# Patient Record
Sex: Male | Born: 1990 | Race: Black or African American | Hispanic: No | Marital: Single | Smoking: Current some day smoker
Health system: Southern US, Community
[De-identification: ages and names within clinical notes are randomized; demographics above are authoritative.]

## PROBLEM LIST (undated history)

## (undated) HISTORY — PX: ABDOMINAL SURGERY: SHX537

---
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
## 2016-01-10 DIAGNOSIS — K56609 Unspecified intestinal obstruction, unspecified as to partial versus complete obstruction: Secondary | ICD-10-CM

## 2016-01-10 HISTORY — DX: Unspecified intestinal obstruction, unspecified as to partial versus complete obstruction: K56.609

## 2019-08-29 ENCOUNTER — Other Ambulatory Visit: Payer: Self-pay

## 2019-08-29 ENCOUNTER — Emergency Department (HOSPITAL_BASED_OUTPATIENT_CLINIC_OR_DEPARTMENT_OTHER)
Admission: EM | Admit: 2019-08-29 | Discharge: 2019-08-29 | Discharge status: Absent without leave | Payer: BC Managed Care – PPO

## 2021-06-01 ENCOUNTER — Emergency Department (HOSPITAL_BASED_OUTPATIENT_CLINIC_OR_DEPARTMENT_OTHER)
Admission: EM | Admit: 2021-06-01 | Discharge: 2021-06-01 | Disposition: A | Discharge status: Home / Self Care | Payer: BC Managed Care – PPO | Attending: Emergency Medicine | Admitting: Emergency Medicine

## 2021-06-01 ENCOUNTER — Encounter (HOSPITAL_BASED_OUTPATIENT_CLINIC_OR_DEPARTMENT_OTHER): Payer: Self-pay | Admitting: Emergency Medicine

## 2021-06-01 ENCOUNTER — Emergency Department (HOSPITAL_BASED_OUTPATIENT_CLINIC_OR_DEPARTMENT_OTHER): Payer: BC Managed Care – PPO

## 2021-06-01 DIAGNOSIS — R059 Cough, unspecified: Secondary | ICD-10-CM | POA: Diagnosis present

## 2021-06-01 DIAGNOSIS — J069 Acute upper respiratory infection, unspecified: Secondary | ICD-10-CM | POA: Diagnosis not present

## 2021-06-01 DIAGNOSIS — J4521 Mild intermittent asthma with (acute) exacerbation: Secondary | ICD-10-CM | POA: Diagnosis not present

## 2021-06-01 DIAGNOSIS — Z20822 Contact with and (suspected) exposure to covid-19: Secondary | ICD-10-CM | POA: Insufficient documentation

## 2021-06-01 DIAGNOSIS — J45901 Unspecified asthma with (acute) exacerbation: Secondary | ICD-10-CM

## 2021-06-01 LAB — RESP PANEL BY RT-PCR (FLU A&B, COVID) ARPGX2
Influenza A by PCR: NEGATIVE
Influenza B by PCR: NEGATIVE
SARS Coronavirus 2 by RT PCR: NEGATIVE

## 2021-06-01 MED ORDER — PREDNISONE 20 MG PO TABS: 40.0000 mg | ORAL_TABLET | Freq: Every day | ORAL | 0 refills | Status: AC

## 2021-06-01 MED ORDER — ALBUTEROL SULFATE HFA 108 (90 BASE) MCG/ACT IN AERS: 1.0000 | INHALATION_SPRAY | Freq: Four times a day (QID) | RESPIRATORY_TRACT | 0 refills | Status: DC | PRN

## 2021-06-01 MED ORDER — ALBUTEROL SULFATE HFA 108 (90 BASE) MCG/ACT IN AERS
2.0000 | INHALATION_SPRAY | Freq: Once | RESPIRATORY_TRACT | Status: AC
  Administered 2021-06-01: 2 via RESPIRATORY_TRACT
  Filled 2021-06-01: qty 6.7

## 2021-06-01 NOTE — Discharge Instructions (Addendum)
Your chest x-ray did not show any evidence of pneumonia.  You likely have a viral upper respiratory infection with a component of asthma exacerbation.  I sent prednisone and albuterol into the pharmacy for you.  You have any worsening symptoms return to the emergency room otherwise follow-up with your primary care provider at [facility].  Your COVID and flu test have not resulted yet.  If either one of these is positive I will call you otherwise assume they are negative.

## 2021-06-01 NOTE — ED Triage Notes (Signed)
Pt reports cough and wheezing x 2 days; hx of asthma as a child

## 2021-06-01 NOTE — ED Provider Notes (Signed)
[facility] EMERGENCY DEPARTMENT Provider Note   CSN: [id number] Arrival date & time: 06/01/21  1117     History  Chief Complaint  Patient presents with   Cough    Steven Owens is a 31 y.o. male.  31 year old male presents today for evaluation of 2-day duration of cough, wheezing, nasal congestion, and rhinorrhea.  Denies fever, chills, decreased p.o. intake.  Denies shortness of breath, or chest pain.  Has not tried anything prior to arrival other than Aleve with mild relief.  The history is provided by the patient. No language interpreter was used.      Home Medications Prior to Admission medications   Not on File      Allergies    Patient has no known allergies.    Review of Systems   Review of Systems  Constitutional:  Negative for chills and fever.  HENT:  Positive for congestion. Negative for sore throat.   Respiratory:  Positive for cough and wheezing. Negative for shortness of breath.   Cardiovascular:  Negative for chest pain.  Gastrointestinal:  Negative for abdominal pain.  Neurological:  Negative for light-headedness.  All other systems reviewed and are negative.  Physical Exam Updated Vital Signs BP 124/83   Pulse 72   Temp 98.3 F (36.8 C) (Oral)   Resp 20   Ht 6\' 2"  (1.88 m)   Wt 95.3 kg   SpO2 100%   BMI 26.96 kg/m  Physical Exam Vitals and nursing note reviewed.  Constitutional:      General: He is not in acute distress.    Appearance: Normal appearance. He is not ill-appearing.  HENT:     Head: Normocephalic and atraumatic.     Nose: Nose normal.  Eyes:     General: No scleral icterus.    Extraocular Movements: Extraocular movements intact.     Conjunctiva/sclera: Conjunctivae normal.  Cardiovascular:     Rate and Rhythm: Normal rate and regular rhythm.     Pulses: Normal pulses.  Pulmonary:     Effort: Pulmonary effort is normal. No respiratory distress.     Breath sounds: Wheezing (Faint wheezing) present. No rales.   Abdominal:     General: There is no distension.     Tenderness: There is no abdominal tenderness.  Musculoskeletal:        General: Normal range of motion.     Cervical back: Normal range of motion.  Skin:    General: Skin is warm and dry.  Neurological:     General: No focal deficit present.     Mental Status: He is alert. Mental status is at baseline.    ED Results / Procedures / Treatments   Labs (all labs ordered are listed, but only abnormal results are displayed) Labs Reviewed  RESP PANEL BY RT-PCR (FLU A&B, COVID) [id number]    EKG None  Radiology DG Chest 2 View  Result Date: 06/01/2021 CLINICAL DATA:  Productive cough. Additional history provided: Patient reports cough and wheezing for 2 days, history of asthma as a child. EXAM: CHEST - 2 VIEW COMPARISON:  Prior chest radiographs 04/04/2016 and earlier. FINDINGS: Heart size within normal limits. No appreciable airspace consolidation. No evidence of pleural effusion or pneumothorax. No acute bony abnormality identified. IMPRESSION: No evidence of active cardiopulmonary disease. Electronically Signed   By: 04/06/2016 D.O.   On: 06/01/2021 11:57    Procedures Procedures    Medications Ordered in ED Medications  albuterol (VENTOLIN HFA) 108 (90 Base) MCG/ACT  inhaler 2 puff (2 puffs Inhalation Given 06/01/21 1154)    ED Course/ Medical Decision Making/ A&P                           Medical Decision Making Amount and/or Complexity of Data Reviewed Radiology: ordered.  Risk Prescription drug management.   31 year old male presents today for evaluation of URI symptoms.  Chest x-ray without acute cardiopulmonary process.  COVID and flu Ordered.  Albuterol inhaler provided.  Symptomatic treatment discussed.  Patient voices understanding and is in agreement with plan.  Most likely viral upper respiratory infection with a component of asthma exacerbation.  Will provide albuterol inhaler, and prednisone.  Follow-up with PCP  discussed.  Return precautions discussed. COVID and flu negative.   Final Clinical Impression(s) / ED Diagnoses Final diagnoses:  Viral upper respiratory tract infection  Mild asthma with exacerbation, unspecified whether persistent    Rx / DC Orders ED Discharge Orders          Ordered    albuterol (VENTOLIN HFA) 108 (90 Base) MCG/ACT inhaler  Every 6 hours PRN        06/01/21 1245    predniSONE (DELTASONE) 20 MG tablet  Daily with breakfast        06/01/21 1245              Marita Kansas, PA-C 06/01/21 1317    Maia Plan, MD 06/05/21 1307

## 2021-06-01 NOTE — ED Notes (Signed)
Dc instructions and scripts reviewed with pt no questions or concerns at this time.  

## 2022-01-05 ENCOUNTER — Encounter (HOSPITAL_BASED_OUTPATIENT_CLINIC_OR_DEPARTMENT_OTHER): Payer: Self-pay | Admitting: Emergency Medicine

## 2022-01-05 ENCOUNTER — Other Ambulatory Visit: Payer: Self-pay

## 2022-01-05 ENCOUNTER — Emergency Department (HOSPITAL_BASED_OUTPATIENT_CLINIC_OR_DEPARTMENT_OTHER)
Admission: EM | Admit: 2022-01-05 | Discharge: 2022-01-05 | Disposition: A | Discharge status: Home / Self Care | Payer: BC Managed Care – PPO | Attending: Emergency Medicine | Admitting: Emergency Medicine

## 2022-01-05 DIAGNOSIS — Z1152 Encounter for screening for COVID-19: Secondary | ICD-10-CM | POA: Diagnosis not present

## 2022-01-05 DIAGNOSIS — J101 Influenza due to other identified influenza virus with other respiratory manifestations: Secondary | ICD-10-CM | POA: Insufficient documentation

## 2022-01-05 DIAGNOSIS — J111 Influenza due to unidentified influenza virus with other respiratory manifestations: Secondary | ICD-10-CM

## 2022-01-05 DIAGNOSIS — R519 Headache, unspecified: Secondary | ICD-10-CM | POA: Diagnosis present

## 2022-01-05 LAB — RESP PANEL BY RT-PCR (RSV, FLU A&B, COVID)  RVPGX2
Influenza A by PCR: POSITIVE — AB
Influenza B by PCR: NEGATIVE
Resp Syncytial Virus by PCR: NEGATIVE
SARS Coronavirus 2 by RT PCR: NEGATIVE

## 2022-01-05 MED ORDER — IBUPROFEN 800 MG PO TABS: 800.0000 mg | ORAL_TABLET | Freq: Three times a day (TID) | ORAL | 0 refills | Status: DC | PRN

## 2022-01-05 MED ORDER — FLUTICASONE PROPIONATE 50 MCG/ACT NA SUSP: 2.0000 | Freq: Every day | NASAL | 0 refills | Status: DC

## 2022-01-05 MED ORDER — BENZONATATE 100 MG PO CAPS: 100.0000 mg | ORAL_CAPSULE | Freq: Three times a day (TID) | ORAL | 0 refills | Status: DC | PRN

## 2022-01-05 MED ORDER — ALBUTEROL SULFATE HFA 108 (90 BASE) MCG/ACT IN AERS: 1.0000 | INHALATION_SPRAY | Freq: Four times a day (QID) | RESPIRATORY_TRACT | 0 refills | Status: DC | PRN

## 2022-01-05 MED ORDER — KETOROLAC TROMETHAMINE 30 MG/ML IJ SOLN
30.0000 mg | Freq: Once | INTRAMUSCULAR | Status: AC
  Administered 2022-01-05: 30 mg via INTRAMUSCULAR
  Filled 2022-01-05: qty 1

## 2022-01-05 NOTE — ED Provider Notes (Signed)
Emergency Department Provider Note   I have reviewed the triage vital signs and the nursing notes.   HISTORY  Chief Complaint Headache   HPI Steven Owens is a 31 y.o. male presents the emergency department for evaluation of flulike symptoms.  Patient has had headache along with cough and chills since yesterday.  No known sick contacts.  No chest pain or shortness of breath.  No vomiting or diarrhea.   History reviewed. No pertinent past medical history.  Review of Systems  Constitutional: No fever. Positive chills and fatigue.  Eyes: No visual changes. ENT: Positive sore throat. Cardiovascular: Denies chest pain. Respiratory: Denies shortness of breath. Positive cough.  Gastrointestinal: No abdominal pain.  No nausea, no vomiting.  No diarrhea.  No constipation. Genitourinary: Negative for dysuria. Musculoskeletal: Negative for back pain. Skin: Negative for rash. Neurological: Positive HA.    ____________________________________________   PHYSICAL EXAM:  VITAL SIGNS: Vitals:   01/05/22 0545  BP: (!) 145/88  Pulse: 90  Resp: 18  Temp: 99 F (37.2 C)  SpO2: 97%    Constitutional: Alert and oriented. Well appearing and in no acute distress. Eyes: Conjunctivae are normal.  Head: Atraumatic. Nose: Positive congestion/rhinnorhea. Mouth/Throat: Mucous membranes are moist.  Oropharynx non-erythematous. Neck: No stridor.  Cardiovascular: Normal rate, regular rhythm. Good peripheral circulation. Grossly normal heart sounds.   Respiratory: Normal respiratory effort.  No retractions. Lungs CTAB. Gastrointestinal: Soft and nontender. No distention.  Musculoskeletal: No gross deformities of extremities. Neurologic:  Normal speech and language.  Skin:  Skin is warm, dry and intact. No rash noted.  ____________________________________________   LABS (all labs ordered are listed, but only abnormal results are displayed)  Labs Reviewed  RESP PANEL BY RT-PCR (RSV,  FLU A&B, COVID)  RVPGX2 - Abnormal; Notable for the following components:      Result Value   Influenza A by PCR POSITIVE (*)    All other components within normal limits    __________________________________________   PROCEDURES  Procedure(s) performed:   Procedures  None  ____________________________________________   INITIAL IMPRESSION / ASSESSMENT AND PLAN / ED COURSE  Pertinent labs & imaging results that were available during my care of the patient were reviewed by me and considered in my medical decision making (see chart for details).   This patient is Presenting for Evaluation of flu-like symptoms, which does require a range of treatment options, and is a complaint that involves a moderate risk of morbidity and mortality.  The Differential Diagnoses include COVID, Flu, RSV, CAP, etc.  Critical Interventions-    Medications  ketorolac (TORADOL) 30 MG/ML injection 30 mg (30 mg Intramuscular Given 01/05/22 0554)    Reassessment after intervention: Symptoms improved.    Clinical Laboratory Tests Ordered, included Flu A positive.    Medical Decision Making: Summary:  Presents emergency department with flulike symptoms.  Testing positive here for flu A.  No hypoxemia.  Considered chest x-ray but no adventitious lung sounds.  No hypoxemia.   Patient's presentation is most consistent with acute illness / injury with system symptoms.   Disposition: discharge  ____________________________________________  FINAL CLINICAL IMPRESSION(S) / ED DIAGNOSES  Final diagnoses:  Influenza-like illness     NEW OUTPATIENT MEDICATIONS STARTED DURING THIS VISIT:  Discharge Medication List as of 01/05/2022  5:50 AM     START taking these medications   Details  benzonatate (TESSALON) 100 MG capsule Take 1 capsule (100 mg total) by mouth 3 (three) times daily as needed for cough., Starting Thu  01/05/2022, Normal    fluticasone (FLONASE) 50 MCG/ACT nasal spray Place 2  sprays into both nostrils daily for 7 days., Starting Thu 01/05/2022, Until Thu 01/12/2022, Normal    ibuprofen (ADVIL) 800 MG tablet Take 1 tablet (800 mg total) by mouth every 8 (eight) hours as needed for moderate pain., Starting Thu 01/05/2022, Normal        Note:  This document was prepared using Dragon voice recognition software and may include unintentional dictation errors.  Nanda Quinton, MD, [facility]    Rachid Parham, Wonda Olds, MD 01/10/22 [phone]

## 2022-01-05 NOTE — ED Triage Notes (Signed)
Pt reports HA, cough,and chills since yesterday. No sick contacts he is aware of.

## 2022-01-05 NOTE — Discharge Instructions (Addendum)
You were diagnosed with the flu-like illness.  You will feel ill for as much as a few weeks.  Please take any prescribed medications as instructed, and you may use over-the-counter Tylenol and/or ibuprofen as needed according to label instructions (unless you have an allergy to either or have been told by your doctor not to take them). ° °Follow up with your physician as instructed above, and return to the Emergency Department (ED) if you are unable to tolerate fluids due to vomiting, have worsening trouble breathing, become extremely tired or difficult to awaken, or if you develop any other symptoms that concern you. ° °

## 2022-02-01 ENCOUNTER — Encounter (HOSPITAL_BASED_OUTPATIENT_CLINIC_OR_DEPARTMENT_OTHER): Payer: Self-pay | Admitting: Emergency Medicine

## 2022-02-01 ENCOUNTER — Emergency Department (HOSPITAL_BASED_OUTPATIENT_CLINIC_OR_DEPARTMENT_OTHER): Payer: BC Managed Care – PPO

## 2022-02-01 ENCOUNTER — Other Ambulatory Visit: Payer: Self-pay

## 2022-02-01 ENCOUNTER — Emergency Department (HOSPITAL_BASED_OUTPATIENT_CLINIC_OR_DEPARTMENT_OTHER)
Admission: EM | Admit: 2022-02-01 | Discharge: 2022-02-01 | Disposition: A | Discharge status: Home / Self Care | Payer: BC Managed Care – PPO | Attending: Emergency Medicine | Admitting: Emergency Medicine

## 2022-02-01 DIAGNOSIS — F17200 Nicotine dependence, unspecified, uncomplicated: Secondary | ICD-10-CM | POA: Insufficient documentation

## 2022-02-01 DIAGNOSIS — R0789 Other chest pain: Secondary | ICD-10-CM | POA: Diagnosis not present

## 2022-02-01 DIAGNOSIS — R079 Chest pain, unspecified: Secondary | ICD-10-CM | POA: Diagnosis present

## 2022-02-01 MED ORDER — IBUPROFEN 800 MG PO TABS
800.0000 mg | ORAL_TABLET | Freq: Once | ORAL | Status: AC
  Administered 2022-02-01: 800 mg via ORAL
  Filled 2022-02-01: qty 1

## 2022-02-01 NOTE — ED Provider Notes (Signed)
[place] [facility]ERGENCY DEPARTMENT [facility] Provider Note   CSN: [id number] Arrival date & time: 02/01/22  7989     History  Chief Complaint  Patient presents with   Chest Pain    Steven Owens is a 32 y.o. male.  The history is provided by the patient.  Chest Pain Steven Owens is a 32 y.o. male who presents to the Emergency Department complaining of chest pain.  He presents to the emergency department for evaluation of sharp, right-sided chest pain that started on Monday.  Pain is worse with movement, twisting and bending as well as laying down.  No associated fevers, shortness of breath, cough, abdominal pain, nausea, vomiting, leg swelling or pain.  He is right-hand dominant.  No reported injuries.  He does work for [employer].  He has a remote history of abdominal surgery for a bowel obstruction x 2, no recent surgeries.  No history of DVT/PE.  He uses occasional tobacco.      Home Medications Prior to Admission medications   Medication Sig Start Date End Date Taking? Authorizing Provider  albuterol (VENTOLIN HFA) 108 (90 Base) MCG/ACT inhaler Inhale 1-2 puffs into the lungs every 6 (six) hours as needed. 01/05/22   Long, Wonda Olds, MD  benzonatate (TESSALON) 100 MG capsule Take 1 capsule (100 mg total) by mouth 3 (three) times daily as needed for cough. 01/05/22   Long, Wonda Olds, MD  fluticasone (FLONASE) 50 MCG/ACT nasal spray Place 2 sprays into both nostrils daily for 7 days. 01/05/22 01/12/22  Long, Wonda Olds, MD  ibuprofen (ADVIL) 800 MG tablet Take 1 tablet (800 mg total) by mouth every 8 (eight) hours as needed for moderate pain. 01/05/22   Long, Wonda Olds, MD      Allergies    Patient has no known allergies.    Review of Systems   Review of Systems  Cardiovascular:  Positive for chest pain.  All other systems reviewed and are negative.   Physical Exam Updated Vital Signs BP 131/87 (BP Location: Right Arm)   Pulse 75   Temp 97.8 F (36.6  C) (Oral)   Resp 16   Ht 6\' 1"  (1.854 m)   Wt 96 kg   SpO2 100%   BMI 27.92 kg/m  Physical Exam Vitals and nursing note reviewed.  Constitutional:      Appearance: He is well-developed.  HENT:     Head: Normocephalic and atraumatic.  Cardiovascular:     Rate and Rhythm: Normal rate and regular rhythm.     Heart sounds: No murmur heard. Pulmonary:     Effort: Pulmonary effort is normal. No respiratory distress.     Breath sounds: Normal breath sounds.  Chest:     Chest wall: No tenderness.  Abdominal:     Palpations: Abdomen is soft.     Tenderness: There is no abdominal tenderness. There is no guarding or rebound.  Musculoskeletal:        General: No swelling or tenderness.     Comments: 2+ DP pulses bilaterally  Skin:    General: Skin is warm and dry.  Neurological:     Mental Status: He is alert and oriented to person, place, and time.  Psychiatric:        Behavior: Behavior normal.     ED Results / Procedures / Treatments   Labs (all labs ordered are listed, but only abnormal results are displayed) Labs Reviewed - No data to display  EKG EKG  Interpretation  Date/Time:  Wednesday February 01 2022 05:17:44 EST Ventricular Rate:  72 PR Interval:  157 QRS Duration: 97 QT Interval:  370 QTC Calculation: 405 R Axis:   61 Text Interpretation: Sinus rhythm ST elev, probable normal early repol pattern No previous ECGs available Confirmed by Quintella Reichert [phone]) on 02/01/2022 5:25:57 AM  Radiology DG Chest 2 View  Result Date: 02/01/2022 CLINICAL DATA:  32 year old male with history of chest pain. EXAM: CHEST - 2 VIEW COMPARISON:  Chest x-ray 06/01/2021. FINDINGS: Lung volumes are normal. No consolidative airspace disease. No pleural effusions. No pneumothorax. No pulmonary nodule or mass noted. Pulmonary vasculature and the cardiomediastinal silhouette are within normal limits. IMPRESSION: No radiographic evidence of acute cardiopulmonary disease. Electronically  Signed   By: Vinnie Langton M.D.   On: 02/01/2022 05:37    Procedures Procedures    Medications Ordered in ED Medications  ibuprofen (ADVIL) tablet 800 mg (800 mg Oral Given 02/01/22 6045)    ED Course/ Medical Decision Making/ A&P                             Medical Decision Making Amount and/or Complexity of Data Reviewed Radiology: ordered.  Risk Prescription drug management.   Patient here for evaluation of atraumatic right-sided chest pain.  Pain is reproducible with range of motion of the arms, trunk.  Chest x-ray is negative for acute abnormality-images personally reviewed and interpreted, agree with radiologist interpretation.  EKG is without acute ischemic changes.  He has no associated abdominal tenderness or respiratory symptoms.  Discussed with patient home care for chest pain, likely musculoskeletal in nature.  Discussed OTC analgesics as needed with decreased lifting at work.  Also discussed close return precautions if he has any progressive or new concerning symptoms.  Current clinical picture is not consistent with ACS, PE, dissection, referred abdominal process, pneumonia.        Final Clinical Impression(s) / ED Diagnoses Final diagnoses:  Chest wall pain    Rx / DC Orders ED Discharge Orders     None         Quintella Reichert, MD 02/01/22 [phone]

## 2022-02-01 NOTE — ED Triage Notes (Signed)
Pt states Chest pain since Monday, is worse when moving or laying down. Denies injury.

## 2022-02-01 NOTE — Discharge Instructions (Addendum)
You may take ibuprofen or acetaminophen, available over-the-counter according to label instructions as needed for pain.

## 2022-04-29 ENCOUNTER — Encounter (HOSPITAL_BASED_OUTPATIENT_CLINIC_OR_DEPARTMENT_OTHER): Payer: Self-pay | Admitting: Emergency Medicine

## 2022-04-29 ENCOUNTER — Emergency Department (HOSPITAL_BASED_OUTPATIENT_CLINIC_OR_DEPARTMENT_OTHER): Payer: Self-pay

## 2022-04-29 ENCOUNTER — Emergency Department (HOSPITAL_BASED_OUTPATIENT_CLINIC_OR_DEPARTMENT_OTHER)
Admission: EM | Admit: 2022-04-29 | Discharge: 2022-04-29 | Disposition: A | Discharge status: Home / Self Care | Payer: Self-pay | Attending: Emergency Medicine | Admitting: Emergency Medicine

## 2022-04-29 DIAGNOSIS — S61215A Laceration without foreign body of left ring finger without damage to nail, initial encounter: Secondary | ICD-10-CM | POA: Insufficient documentation

## 2022-04-29 DIAGNOSIS — Z23 Encounter for immunization: Secondary | ICD-10-CM | POA: Insufficient documentation

## 2022-04-29 DIAGNOSIS — W269XXA Contact with unspecified sharp object(s), initial encounter: Secondary | ICD-10-CM | POA: Insufficient documentation

## 2022-04-29 DIAGNOSIS — Y93G3 Activity, cooking and baking: Secondary | ICD-10-CM | POA: Insufficient documentation

## 2022-04-29 MED ORDER — TETANUS-DIPHTH-ACELL PERTUSSIS 5-2.5-18.5 LF-MCG/0.5 IM SUSY
0.5000 mL | PREFILLED_SYRINGE | Freq: Once | INTRAMUSCULAR | Status: AC
  Administered 2022-04-29: 0.5 mL via INTRAMUSCULAR
  Filled 2022-04-29: qty 0.5

## 2022-04-29 NOTE — ED Provider Notes (Signed)
[place] EMERGENCY DEPARTMENT AT [facility] Provider Note   CSN: [id number] Arrival date & time: 04/29/22  1947     History Chief Complaint  Patient presents with   Laceration    Steven Owens is a 32 y.o. male.  Patient presents to the emergency department complaints of a finger laceration.  He reports that he cut his left ring finger while he was cooking earlier tonight.  Reports the bleeding was stopped prior to arrival to the emergency department.  He is unsure if he is up-to-date on his tetanus vaccination.  Believes it may have been more than 10 years.  Reports he still able to move his finger without significant difficulty or pain.  Denies any numbness in the finger.  States that pain is not severe at this point.   Laceration      Home Medications Prior to Admission medications   Medication Sig Start Date End Date Taking? Authorizing Provider  albuterol (VENTOLIN HFA) 108 (90 Base) MCG/ACT inhaler Inhale 1-2 puffs into the lungs every 6 (six) hours as needed. 01/05/22   Long, Arlyss Repress, MD  benzonatate (TESSALON) 100 MG capsule Take 1 capsule (100 mg total) by mouth 3 (three) times daily as needed for cough. 01/05/22   Long, Arlyss Repress, MD  fluticasone (FLONASE) 50 MCG/ACT nasal spray Place 2 sprays into both nostrils daily for 7 days. 01/05/22 01/12/22  Long, Arlyss Repress, MD  ibuprofen (ADVIL) 800 MG tablet Take 1 tablet (800 mg total) by mouth every 8 (eight) hours as needed for moderate pain. 01/05/22   Long, Arlyss Repress, MD      Allergies    Patient has no known allergies.    Review of Systems   Review of Systems  Skin:  Positive for wound.  All other systems reviewed and are negative.   Physical Exam Updated Vital Signs BP (!) 139/96 (BP Location: Right Arm)   Pulse 80   Temp 98.7 F (37.1 C) (Oral)   Resp 18   Ht  (1.854 m)   Wt 99.8 kg   SpO2 98%   BMI 29.03 kg/m  Physical Exam Vitals and nursing note reviewed.  HENT:     Head: Normocephalic  and atraumatic.  Eyes:     General:        Right eye: No discharge.        Left eye: No discharge.     Conjunctiva/sclera: Conjunctivae normal.  Musculoskeletal:        General: No swelling, tenderness or deformity.  Skin:    General: Skin is warm and dry.     Findings: Lesion present.     Comments: 2cm laceration noted to the left ring finger. Depth maybe 1mm with portions of laceration minimally deep.     ED Results / Procedures / Treatments   Labs (all labs ordered are listed, but only abnormal results are displayed) Labs Reviewed - No data to display  EKG None  Radiology DG Finger Ring Left  Result Date: 04/29/2022 CLINICAL DATA:  Pain after laceration EXAM: LEFT RING FINGER 3V COMPARISON:  None Available. FINDINGS: No fracture or dislocation. Preserved joint spaces and bone mineralization. No definite radiopaque foreign body. Overlapping bandage. IMPRESSION: No acute osseous abnormality Electronically Signed   By: Karen Kays M.D.   On: 04/29/2022 21:50    Procedures .Marland KitchenLaceration Repair  Date/Time: 04/29/2022 10:18 PM  Performed by: Smitty Knudsen, PA-C Authorized by: Smitty Knudsen, PA-C   Consent:  Consent obtained:  Verbal   Consent given by:  Patient   Risks, benefits, and alternatives were discussed: yes     Risks discussed:  Infection and pain   Alternatives discussed:  No treatment and referral Universal protocol:    Patient identity confirmed:  Verbally with patient Anesthesia:    Anesthesia method:  None Laceration details:    Location:  Finger   Finger location:  L ring finger   Length (cm):  2   Depth (mm):  1 Pre-procedure details:    Preparation:  Patient was prepped and draped in usual sterile fashion and imaging obtained to evaluate for foreign bodies Treatment:    Area cleansed with:  Povidone-iodine and chlorhexidine   Amount of cleaning:  Standard   Irrigation method:  Pressure wash   Debridement:  None   Undermining:  None Skin  repair:    Repair method:  Tissue adhesive Approximation:    Approximation:  Close Repair type:    Repair type:  Simple Post-procedure details:    Dressing:  Non-adherent dressing   Procedure completion:  Tolerated    Medications Ordered in ED Medications  Tdap (BOOSTRIX) injection 0.5 mL (0.5 mLs Intramuscular Given 04/29/22 2208)    ED Course/ Medical Decision Making/ A&P                           Medical Decision Making Amount and/or Complexity of Data Reviewed Radiology: ordered.  Risk Prescription drug management.   This patient presents to the ED for concern of laceration. Differential diagnosis includes tendon injury, cellulitis, foreign body   Imaging Studies ordered:  I ordered imaging studies including x-ray left ring finger I independently visualized and interpreted imaging which showed no foreign bodies noted I agree with the radiologist interpretation   Medicines ordered and prescription drug management:  I ordered medication including Tdap for tetanus booster Reevaluation of the patient after these medicines showed that the patient unchanged I have reviewed the patients home medicines and have made adjustments as needed   Problem List / ED Course:  Patient presented to the emergency department for a laceration of the left ring finger.  He reports that he injured this while cutting meat for dinner.  He reports the bleeding was contained prior to arriving here in the emergency department.  Not currently any blood thinners.  Reports that he is able to move his finger without any significant difficulty.  Sensation is intact.  Given reassuring presentation, extremity was performed to ensure there is no retained foreign objects in the laceration site.  X-ray imaging was negative.  Laceration repair performed using Dermabond given that laceration had minimal depth.  Patient agreeable with Dermabond repair and verbalized understanding all return precautions have  wound were to reopen.  Advised patient to follow-up with primary care provider for further evaluation to ensure the area is healing properly.  All questions answered prior to patient discharge.  Final Clinical Impression(s) / ED Diagnoses Final diagnoses:  Laceration of left ring finger without foreign body without damage to nail, initial encounter    Rx / DC Orders ED Discharge Orders     None         Smitty Knudsen, PA-C 04/29/22 2220    Melene Plan, DO 04/29/22 2232

## 2022-04-29 NOTE — Discharge Instructions (Signed)
You were seen in the emergency department for a laceration to the left ring finger.  Based on my assessment, I repaired this area using Dermabond adhesive glue.  This area should remain intact for the next 7 to 14 days but I would advise you to avoid soaking the area or submersing in water such as in a bathtub or pool or hot tub.  Please follow-up with your primary care provider for further evaluation to ensure that the area is healing without complications.  You are also given a Tdap booster here in the emergency department given that your Tdap cannot be located on your chart and you are unsure if your booster was more than 30 years old.  If you are concerned that you are actively developing infection in this area, please return to the emergency department or follow-up with your primary care provider.

## 2022-04-29 NOTE — ED Triage Notes (Signed)
Pt w/ laceration to LT ring finger while cooking tonight

## 2023-02-16 IMAGING — DX DG CHEST 2V
2 series · 2 of 2 positions shown · non-contrast
Comparison: Prior chest radiographs 04/04/2016 and earlier.

CLINICAL DATA: Productive cough. Additional history provided:
Patient reports cough and wheezing for 2 days, history of asthma as
a child.

EXAM:
CHEST - 2 VIEW

[chest pa]
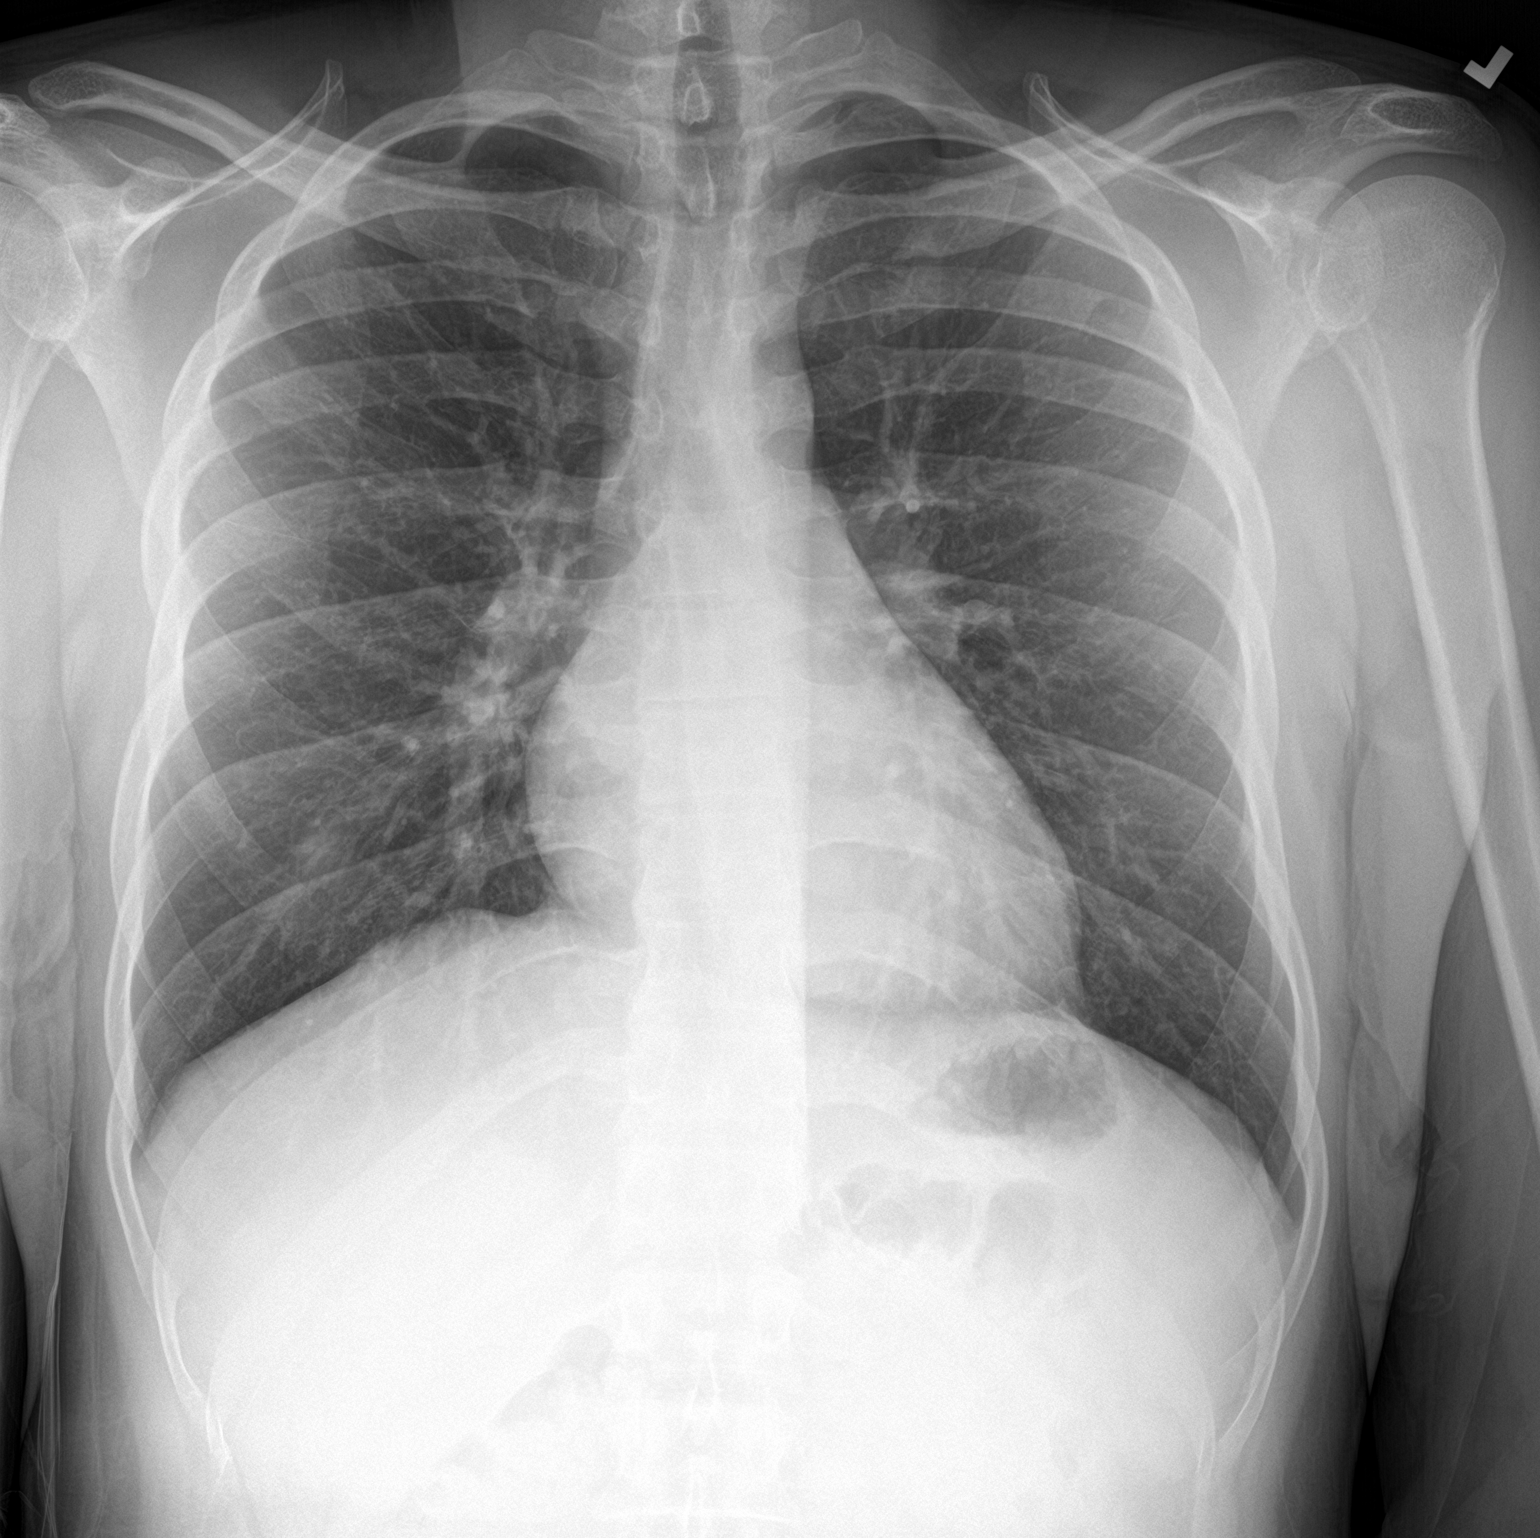

[chest lat]
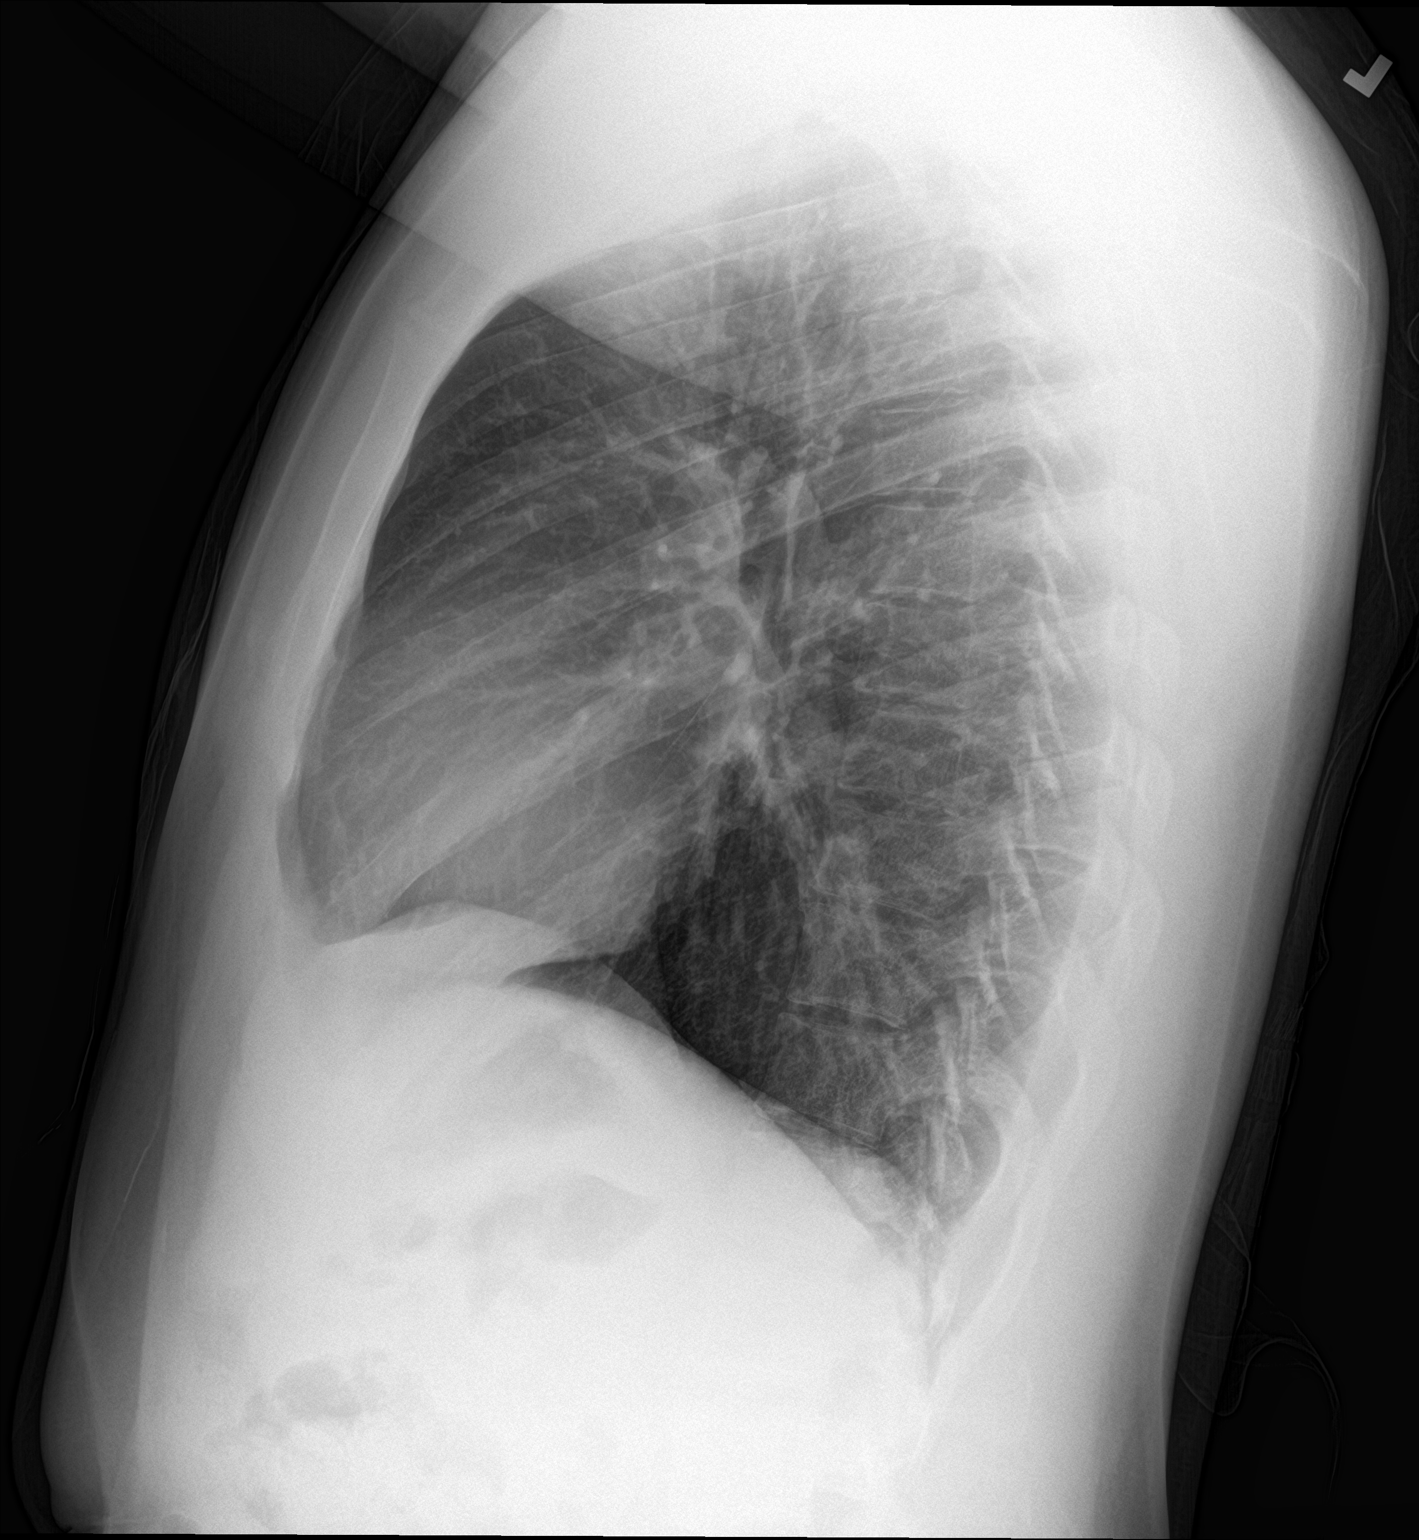

[2 of 2 positions shown; findings below may reference images not displayed]

FINDINGS: Heart size within normal limits. No appreciable airspace
consolidation. No evidence of pleural effusion or pneumothorax. No
acute bony abnormality identified.
IMPRESSION: No evidence of active cardiopulmonary disease.

## 2023-08-14 ENCOUNTER — Inpatient Hospital Stay (HOSPITAL_COMMUNITY): Payer: Self-pay

## 2023-08-14 ENCOUNTER — Encounter (HOSPITAL_BASED_OUTPATIENT_CLINIC_OR_DEPARTMENT_OTHER): Payer: Self-pay

## 2023-08-14 ENCOUNTER — Inpatient Hospital Stay (HOSPITAL_BASED_OUTPATIENT_CLINIC_OR_DEPARTMENT_OTHER)
Admission: EM | Admit: 2023-08-14 | Discharge: 2023-08-18 | DRG: 390 | Disposition: A | Discharge status: Home / Self Care | Payer: MEDICAID | Attending: Internal Medicine | Admitting: Internal Medicine

## 2023-08-14 ENCOUNTER — Emergency Department (HOSPITAL_BASED_OUTPATIENT_CLINIC_OR_DEPARTMENT_OTHER): Payer: Self-pay

## 2023-08-14 ENCOUNTER — Other Ambulatory Visit: Payer: Self-pay

## 2023-08-14 DIAGNOSIS — R109 Unspecified abdominal pain: Secondary | ICD-10-CM

## 2023-08-14 DIAGNOSIS — K56609 Unspecified intestinal obstruction, unspecified as to partial versus complete obstruction: Secondary | ICD-10-CM | POA: Diagnosis present

## 2023-08-14 DIAGNOSIS — F17210 Nicotine dependence, cigarettes, uncomplicated: Secondary | ICD-10-CM | POA: Diagnosis present

## 2023-08-14 DIAGNOSIS — Z8719 Personal history of other diseases of the digestive system: Secondary | ICD-10-CM

## 2023-08-14 DIAGNOSIS — I10 Essential (primary) hypertension: Secondary | ICD-10-CM | POA: Diagnosis present

## 2023-08-14 DIAGNOSIS — Z1152 Encounter for screening for COVID-19: Secondary | ICD-10-CM

## 2023-08-14 DIAGNOSIS — K56600 Partial intestinal obstruction, unspecified as to cause: Principal | ICD-10-CM | POA: Diagnosis present

## 2023-08-14 DIAGNOSIS — K5900 Constipation, unspecified: Secondary | ICD-10-CM | POA: Diagnosis present

## 2023-08-14 LAB — COMPREHENSIVE METABOLIC PANEL WITH GFR
ALT: 27 U/L (ref 0–44)
AST: 22 U/L (ref 15–41)
Albumin: 4.5 g/dL (ref 3.5–5.0)
Alkaline Phosphatase: 47 U/L (ref 38–126)
Anion gap: 12 (ref 5–15)
BUN: 7 mg/dL (ref 6–20)
CO2: 25 mmol/L (ref 22–32)
Calcium: 9.2 mg/dL (ref 8.9–10.3)
Chloride: 102 mmol/L (ref 98–111)
Creatinine, Ser: 0.93 mg/dL (ref 0.61–1.24)
GFR, Estimated: >60 mL/min (ref >60)
Glucose, Bld: 104 mg/dL — ABNORMAL HIGH (ref 70–99)
Potassium: 4.2 mmol/L (ref 3.5–5.1)
Sodium: 139 mmol/L (ref 135–145)
Total Bilirubin: 1.6 mg/dL — ABNORMAL HIGH (ref 0.0–1.2)
Total Protein: 6.9 g/dL (ref 6.5–8.1)

## 2023-08-14 LAB — URINALYSIS, ROUTINE W REFLEX MICROSCOPIC
Bilirubin Urine: NEGATIVE
Glucose, UA: NEGATIVE mg/dL
Hgb urine dipstick: NEGATIVE
Ketones, ur: NEGATIVE mg/dL
Leukocytes,Ua: NEGATIVE
Nitrite: NEGATIVE
Protein, ur: NEGATIVE mg/dL
Specific Gravity, Urine: 1.015 (ref 1.005–1.030)
pH: 6 (ref 5.0–8.0)

## 2023-08-14 LAB — CBC
HCT: 45.6 % (ref 39.0–52.0)
Hemoglobin: 15.1 g/dL (ref 13.0–17.0)
MCH: 29.4 pg (ref 26.0–34.0)
MCHC: 33.1 g/dL (ref 30.0–36.0)
MCV: 88.7 fL (ref 80.0–100.0)
Platelets: 208 K/uL (ref 150–400)
RBC: 5.14 MIL/uL (ref 4.22–5.81)
RDW: 13.1 % (ref 11.5–15.5)
WBC: 3.6 K/uL — ABNORMAL LOW (ref 4.0–10.5)
nRBC: 0 % (ref 0.0–0.2)

## 2023-08-14 LAB — LIPASE, BLOOD: Lipase: 13 U/L (ref 11–51)

## 2023-08-14 MED ORDER — ALBUTEROL SULFATE (2.5 MG/3ML) 0.083% IN NEBU
2.5000 mg | INHALATION_SOLUTION | RESPIRATORY_TRACT | Status: DC | PRN
Start: 2023-08-14 — End: 2023-08-18

## 2023-08-14 MED ORDER — MORPHINE SULFATE (PF) 4 MG/ML IV SOLN
4.0000 mg | Freq: Once | INTRAVENOUS | Status: AC
  Administered 2023-08-14: 4 mg via INTRAVENOUS
  Filled 2023-08-14: qty 1

## 2023-08-14 MED ORDER — ONDANSETRON HCL 4 MG/2ML IJ SOLN: 4.0000 mg | Freq: Four times a day (QID) | INTRAMUSCULAR | Status: DC | PRN

## 2023-08-14 MED ORDER — DIATRIZOATE MEGLUMINE & SODIUM 66-10 % PO SOLN
90.0000 mL | Freq: Once | ORAL | Status: AC
  Administered 2023-08-14: 90 mL via NASOGASTRIC
  Filled 2023-08-14: qty 90

## 2023-08-14 MED ORDER — ACETAMINOPHEN 325 MG PO TABS
650.0000 mg | ORAL_TABLET | Freq: Four times a day (QID) | ORAL | Status: DC | PRN
  Administered 2023-08-15: 650 mg via ORAL
  Filled 2023-08-14: qty 2

## 2023-08-14 MED ORDER — ONDANSETRON HCL 4 MG PO TABS: 4.0000 mg | ORAL_TABLET | Freq: Four times a day (QID) | ORAL | Status: DC | PRN

## 2023-08-14 MED ORDER — ACETAMINOPHEN 650 MG RE SUPP: 650.0000 mg | Freq: Four times a day (QID) | RECTAL | Status: DC | PRN

## 2023-08-14 MED ORDER — ONDANSETRON HCL 4 MG/2ML IJ SOLN
4.0000 mg | Freq: Once | INTRAMUSCULAR | Status: AC
  Administered 2023-08-14: 4 mg via INTRAVENOUS
  Filled 2023-08-14: qty 2

## 2023-08-14 MED ORDER — HYDROMORPHONE HCL 1 MG/ML IJ SOLN: 0.5000 mg | INTRAMUSCULAR | Status: DC | PRN

## 2023-08-14 MED ORDER — SODIUM CHLORIDE 0.9 % IV SOLN: INTRAVENOUS | Status: AC

## 2023-08-14 MED ORDER — ENOXAPARIN SODIUM 40 MG/0.4ML IJ SOSY
40.0000 mg | PREFILLED_SYRINGE | INTRAMUSCULAR | Status: DC
  Administered 2023-08-14 – 2023-08-18 (×5): 40 mg via SUBCUTANEOUS
  Filled 2023-08-14 (×5): qty 0.4

## 2023-08-14 MED ORDER — IOHEXOL 300 MG/ML  SOLN
100.0000 mL | Freq: Once | INTRAMUSCULAR | Status: AC | PRN
  Administered 2023-08-14: 100 mL via INTRAVENOUS

## 2023-08-14 MED ORDER — OXYCODONE HCL 5 MG PO TABS: 5.0000 mg | ORAL_TABLET | ORAL | Status: DC | PRN

## 2023-08-14 NOTE — ED Provider Notes (Signed)
  EMERGENCY DEPARTMENT AT [facility] Provider Note   CSN: [id number] Arrival date & time: 08/14/23  0710     Patient presents with: Abdominal Pain   Steven Owens is a 33 y.o. male.   33 year old male with history of SBO requiring abdominal surgery in 2014 presenting due to 1 day history of lower mid abdominal pain and cramping Denies N/V/D, fevers, chest pain, shortness of breath, heartburn/reflux, dysuria, hematuria  Had a small BM this morning but has been feeling constipated Aside from SBO surgery denies additional surgical hx or medical hx; does not take any medications daily; has not tried any medication for his pain Drinks alcohol occasionally about twice weekly, most recently Saturday, smokes cigarettes every other day, denies marijuana/other illicit drugs. Denies sexual activity or concern for STDs    Abdominal Pain Associated symptoms: constipation   Associated symptoms: no chest pain, no chills, no cough, no diarrhea, no fever, no hematuria, no nausea, no shortness of breath and no vomiting        Prior to Admission medications   Medication Sig Start Date End Date Taking? Authorizing Provider  albuterol  (VENTOLIN  HFA) 108 (90 Base) MCG/ACT inhaler Inhale 1-2 puffs into the lungs every 6 (six) hours as needed. 01/05/22   Long, Fonda MATSU, MD  benzonatate  (TESSALON ) 100 MG capsule Take 1 capsule (100 mg total) by mouth 3 (three) times daily as needed for cough. 01/05/22   Long, Fonda MATSU, MD  fluticasone  (FLONASE ) 50 MCG/ACT nasal spray Place 2 sprays into both nostrils daily for 7 days. 01/05/22 01/12/22  Long, Fonda MATSU, MD  ibuprofen  (ADVIL ) 800 MG tablet Take 1 tablet (800 mg total) by mouth every 8 (eight) hours as needed for moderate pain. 01/05/22   Long, Joshua G, MD    Allergies: Patient has no known allergies.    Review of Systems  Constitutional:  Negative for chills and fever.  Respiratory:  Negative for cough and shortness of breath.    Cardiovascular:  Negative for chest pain.  Gastrointestinal:  Positive for abdominal pain and constipation. Negative for diarrhea, nausea and vomiting.  Genitourinary:  Negative for flank pain, hematuria and testicular pain.  Musculoskeletal:  Negative for back pain.  Neurological:  Negative for weakness and headaches.    Updated Vital Signs BP (!) 142/83 (BP Location: Right Arm)   Pulse 89   Temp 98 F (36.7 C) (Oral)   Resp 16   Ht 6' 1 (1.854 m)   Wt 99.8 kg   SpO2 100%   BMI 29.03 kg/m   Physical Exam Constitutional:      Appearance: He is well-developed.  HENT:     Head: Normocephalic and atraumatic.  Cardiovascular:     Heart sounds: Normal heart sounds. No murmur heard. Pulmonary:     Effort: Pulmonary effort is normal. No respiratory distress.     Breath sounds: Normal breath sounds.  Abdominal:     General: Abdomen is flat. A surgical scar is present. Bowel sounds are normal. There is no distension.     Tenderness: There is abdominal tenderness in the right lower quadrant, suprapubic area, left upper quadrant and left lower quadrant. There is no right CVA tenderness, left CVA tenderness, guarding or rebound.  Neurological:     Mental Status: He is alert.     (all labs ordered are listed, but only abnormal results are displayed) Labs Reviewed  COMPREHENSIVE METABOLIC PANEL WITH GFR - Abnormal; Notable for the following components:  Result Value   Glucose, Bld 104 (*)    Total Bilirubin 1.6 (*)    All other components within normal limits  CBC - Abnormal; Notable for the following components:   WBC 3.6 (*)    All other components within normal limits  LIPASE, BLOOD  URINALYSIS, ROUTINE W REFLEX MICROSCOPIC    EKG: None  Radiology: CT ABDOMEN PELVIS W CONTRAST Result Date: 08/14/2023 CLINICAL DATA:  Abdominal pain, acute, nonlocalized Mid abdominal pain since yesterday. EXAM: CT ABDOMEN AND PELVIS WITH CONTRAST TECHNIQUE: Multidetector CT imaging of  the abdomen and pelvis was performed using the standard protocol following bolus administration of intravenous contrast. RADIATION DOSE REDUCTION: This exam was performed according to the departmental dose-optimization program which includes automated exposure control, adjustment of the mA and/or kV according to patient size and/or use of iterative reconstruction technique. CONTRAST:  OMNIPAQUE  IOHEXOL  300 MG/ML  SOLN COMPARISON:  Abdominopelvic CT 04/01/2016. FINDINGS: Lower chest: Clear lung bases. No significant pleural or pericardial effusion. Hepatobiliary: The liver is normal in density without suspicious focal abnormality. Multiple calcified gallstones are noted. No evidence of gallbladder wall thickening or biliary dilatation. Pancreas: Unremarkable. No pancreatic ductal dilatation or surrounding inflammatory changes. 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 There are new thin septal calcifications within this lesion. No other focal splenic abnormality. Adrenals/Urinary Tract: Both adrenal glands appear normal. No evidence of urinary tract calculus, suspicious renal lesion or hydronephrosis. The bladder appears unremarkable for its degree of distention. Stomach/Bowel: No enteric contrast administered. The stomach appears unremarkable for its degree of distention. Small bowel anastomosis clips are again noted within the left mid abdomen, and there are multiple dilated loops of fluid-filled small bowel, slightly increased from the previous study. The terminal ileum appears decompressed without focal transition point. A fluid-filled structure with internal air in the central false pelvis measures 7.3 x 5.2 cm, appearing contiguous with small bowel on the coronal images, and likely a postsurgical finding. The colon is decompressed. Vascular/Lymphatic: There are no enlarged abdominal or pelvic lymph nodes. No significant vascular  findings. Reproductive: The prostate gland and seminal vesicles appear unremarkable. Other: Postsurgical changes in the anterior abdominal wall. No evidence of hernia, ascites or pneumoperitoneum. Musculoskeletal: No acute or significant osseous findings. IMPRESSION: 1. Recurrent diffuse small bowel dilatation, suspicious for partial distal small bowel obstruction. No focal transition point or etiology identified. 2. Fluid-filled structure with internal air in the central false pelvis appears contiguous with small bowel on the coronal images, and likely a postsurgical finding. 3. Cholelithiasis without evidence of cholecystitis or biliary dilatation. 4. 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 Electronically Signed   By: Elsie Perone M.D.   On: 08/14/2023 10:35     Procedures   Medications Ordered in the ED  morphine  (PF) 4 MG/ML injection 4 mg (4 mg Intravenous Given 08/14/23 0903)  ondansetron  (ZOFRAN ) injection 4 mg (4 mg Intravenous Given 08/14/23 0903)  iohexol  (OMNIPAQUE ) 300 MG/ML solution 100 mL (100 mLs Intravenous Contrast Given 08/14/23 0935)                                    Medical Decision Making 33 year old male with history of SBO requiring surgical intervention in 2014 presenting due to 1 day history of abdominal pain, cramping, constipation Afebrile and hemodynamically stable, with diffuse tenderness but  primarily in RLQ, suprapubic, LUQ, LLQ with normal bowel sounds.  No signs of peritonitis CBC, CMP, lipase, UA ordered Differential includes SBO given his history but reassured by his exam and history Lower concern for infectious etiology, appendicitis given afebrile without vomiting and no leukocytosis Lower concern for hepatobiliary etiology given lack of RUQ pain with normal LFTs and alk phos.  Does have slightly elevated bilirubin. Lower concern for GU etiology given lack of GU symptoms, not sexually active, normal UA Lipase normal and does  not drink excessive alcohol, lower suspicion for acute pancreatitis. No symptoms of GERD to suggest PUD  Given hx of SBO feel it is reasonable to obtain CT abdomen/pelvis with contrast for further evaluation Morphine  for pain with Zofran    10:45 AM CT shows partial SBO without definite transition point Do not feel that surgical intervention or NGT is necessary at this time given his pain is not too severe and without vomiting Would recommend bowel rest and observation overnight Hospitalist consult placed   10:56 AM Spoke with [facility], pt to be admitted to [facility] Advised to reach out to general surgery to FYI - placed consult  12:03 PM Dr. Elnor spoke with on call surgeon. No current plan for NGT or surgery. Pt admitted to [facility]  Amount and/or Complexity of Data Reviewed Labs: ordered. Radiology: ordered.  Risk Prescription drug management. Decision regarding hospitalization.        Final diagnoses:  Partial intestinal obstruction, unspecified cause (HCC)  Abdominal pain, unspecified abdominal location    ED Discharge Orders     None          Romelle Booty, MD 08/14/23 1204    Elnor Savant A, DO 08/15/23 1650

## 2023-08-14 NOTE — Plan of Care (Signed)
   Problem: Education: Goal: Knowledge of General Education information will improve Description Including pain rating scale, medication(s)/side effects and non-pharmacologic comfort measures Outcome: Progressing

## 2023-08-14 NOTE — Consult Note (Signed)
 Consult/Admission Note  Steven Owens 1990-01-16  [id number].    Requesting MD:  Dr. Katha CHRISTELLA [name]  Chief Complaint/Reason for Consult:  Abdominal pain with concern of SBO  HPI:  Blythe Wenner is a 33 year old male with a past medical history of SBO (2014) that presented to [facility] due to abdominal pain. Patient transferred to [facility]. Patient reports that the abdominal pain began yesterday and is centrally located of the abdomen and pelvis. He describes the pain as a sharp sensation. Patient did not try to relieve pain at home. Patient's last bowel movement was this morning. He felt that he was constipated. Denies hematochezia and mucus within stool. Patient's last meal was yesterday (watermelon and water). Denies nausea, vomiting, fevers. Denies concerns with urination.    Of note, patient has had prior abdominal surgeries as an infant/child, but he does not recall specifics. Patient has had a SBO in the past and underwent abdominal surgery for treatment in 2014. He had this completed through [facility]. Patient has no other significant surgical history. Patient has never had a colonoscopy. He takes no other medications daily. Denies anticoagulation medications.   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  Moira Anes (Surgical Oncology). Intestinal adhesions; dense, requiring hours to release during operation [id number] for bowel obstruction. Dehiscence of fascia; POD 7 drainage with intact skin closure, at operation to find infected fascia, debrided  but could not approximate with abdominal vac placed over bowel, subsequently converted to wound vac. The small bowel, likely due to extensive adhesiolysis, was stuck to the peritoneal layer deep to fascia explaining the absence of evisceration.; Last Impression: May 09 2013  wound va currently 10 x 6 cm, no bulge of herniation.   ROS: All negative with the exception of above.   History reviewed. No pertinent family history.  Past Medical History:  Diagnosis Date   SBO (small bowel obstruction) (HCC) 2018    Past Surgical History:  Procedure Laterality Date   ABDOMINAL SURGERY      Social History:  reports that he has been smoking cigarettes and cigars. He has never used smokeless tobacco. He reports current alcohol use. He reports that he does not currently use drugs.  Allergies: No Known Allergies  (Not in a hospital admission)   Vitals:   08/14/23 1159 08/14/23 1243  BP: (!) 142/83 139/88  Pulse: 89 61  Resp: 16 18  Temp: 98 F (36.7 C) 98 F (36.7 C)  SpO2: 100% 94%    Physical Exam:  General: Pleasant, male who is laying in bed in NAD. HEENT: Head is normocephalic, atraumatic.  Sclera are noninjected. Conjunctiva anicteric. EOMI. Heart: Regular, rate, and rhythm. Lungs: Respiratory effort nonlabored Abd: Soft with minimal distention. Generalized tenderness to deep palpation that is more prominent in the umbilical area and central lower pelvis. Midline scarring present from previous procedures No rebound tenderness or guarding. MS: No cyanosis, clubbing, or edema noted of extremities. Skin: Warm and dry with no masses, lesions, or rashes Neuro: Speech clear and moving all extremities with intact sensory Psych: A&Ox3 with an  appropriate affect.   Results for orders placed or performed during the hospital encounter of 08/14/23 (from the past 48 hours)  Lipase, blood     Status: None   Collection Time: 08/14/23  7:24 AM  Result Value Ref Range   Lipase 13 11 - 51  U/L    Comment: Performed at [facility], [address] [place], [place] [address]  Comprehensive metabolic panel     Status: Abnormal   Collection Time: 08/14/23  7:24 AM  Result Value Ref Range   Sodium 139 135 - 145 mmol/L   Potassium 4.2 3.5 - 5.1 mmol/L   Chloride 102 98 - 111 mmol/L   CO2 25 22 - 32 mmol/L   Glucose, Bld 104 (H) 70 - 99 mg/dL    Comment: Glucose reference range applies only to samples taken after fasting for at least 8 hours.   BUN 7 6 - 20 mg/dL   Creatinine, Ser 9.06 0.61 - 1.24 mg/dL   Calcium 9.2 8.9 - 89.6 mg/dL   Total Protein 6.9 6.5 - 8.1 g/dL   Albumin 4.5 3.5 - 5.0 g/dL   AST 22 15 - 41 U/L   ALT 27 0 - 44 U/L   Alkaline Phosphatase 47 38 - 126 U/L   Total Bilirubin 1.6 (H) 0.0 - 1.2 mg/dL   GFR, Estimated >39 >39 mL/min    Comment: (NOTE) Calculated using the CKD-EPI Creatinine Equation (2021)    Anion gap 12 5 - 15    Comment: Performed at [facility], [address] [place], [place] [address]  CBC     Status: Abnormal   Collection Time: 08/14/23  7:24 AM  Result Value Ref Range   WBC 3.6 (L) 4.0 - 10.5 K/uL   RBC 5.14 4.22 - 5.81 MIL/uL   Hemoglobin 15.1 13.0 - 17.0 g/dL   HCT 54.3 60.9 - 47.9 %   MCV 88.7 80.0 - 100.0 fL   MCH 29.4 26.0 - 34.0 pg   MCHC 33.1 30.0 - 36.0 g/dL   RDW 86.8 88.4 - 84.4 %   Platelets 208 150 - 400 K/uL   nRBC 0.0 0.0 - 0.2 %    Comment: Performed at [facility], [address] [place], [place] [address]  Urinalysis, Routine w reflex microscopic -Urine, Clean Catch     Status: None   Collection Time: 08/14/23  7:24 AM  Result Value Ref Range   Color, Urine YELLOW YELLOW   APPearance CLEAR CLEAR   Specific Gravity, Urine 1.015 1.005 - 1.030   pH 6.0 5.0 - 8.0   Glucose, UA NEGATIVE NEGATIVE mg/dL   Hgb urine dipstick NEGATIVE NEGATIVE   Bilirubin Urine NEGATIVE NEGATIVE   Ketones, ur NEGATIVE NEGATIVE mg/dL   Protein, ur NEGATIVE NEGATIVE mg/dL   Nitrite NEGATIVE  NEGATIVE   Leukocytes,Ua NEGATIVE NEGATIVE    Comment: Microscopic not done on urines with negative protein, blood, leukocytes, nitrite, or glucose < 500 mg/dL. Performed at [facility], [address] [place], [place] [address]    CT ABDOMEN PELVIS W CONTRAST Result Date: 08/14/2023 CLINICAL DATA:  Abdominal pain, acute, nonlocalized Mid abdominal pain since yesterday. EXAM: CT ABDOMEN AND PELVIS WITH CONTRAST TECHNIQUE: Multidetector CT imaging of the abdomen and pelvis was performed using the standard protocol following bolus administration of intravenous contrast. RADIATION DOSE REDUCTION: This exam was performed according to the departmental dose-optimization program which includes automated exposure control,  adjustment of the mA and/or kV according to patient size and/or use of iterative reconstruction technique. CONTRAST:  OMNIPAQUE  IOHEXOL  300 MG/ML  SOLN COMPARISON:  Abdominopelvic CT 04/01/2016. FINDINGS: Lower chest: Clear lung bases. No significant pleural or pericardial effusion. Hepatobiliary: The liver is normal in density without suspicious focal abnormality. Multiple calcified gallstones are noted. No evidence of gallbladder wall thickening or biliary dilatation. Pancreas: Unremarkable. No pancreatic ductal dilatation or surrounding inflammatory changes. 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 There are new thin septal calcifications within this lesion. No other focal splenic abnormality. Adrenals/Urinary Tract: Both adrenal glands appear normal. No evidence of urinary tract calculus, suspicious renal lesion or hydronephrosis. The bladder appears unremarkable for its degree of distention. Stomach/Bowel: No enteric contrast administered. The stomach appears unremarkable for its degree of distention. Small bowel anastomosis clips are again noted within the left mid abdomen, and there are multiple  dilated loops of fluid-filled small bowel, slightly increased from the previous study. The terminal ileum appears decompressed without focal transition point. A fluid-filled structure with internal air in the central false pelvis measures 7.3 x 5.2 cm, appearing contiguous with small bowel on the coronal images, and likely a postsurgical finding. The colon is decompressed. Vascular/Lymphatic: There are no enlarged abdominal or pelvic lymph nodes. No significant vascular findings. Reproductive: The prostate gland and seminal vesicles appear unremarkable. Other: Postsurgical changes in the anterior abdominal wall. No evidence of hernia, ascites or pneumoperitoneum. Musculoskeletal: No acute or significant osseous findings. IMPRESSION: 1. Recurrent diffuse small bowel dilatation, suspicious for partial distal small bowel obstruction. No focal transition point or etiology identified. 2. Fluid-filled structure with internal air in the central false pelvis appears contiguous with small bowel on the coronal images, and likely a postsurgical finding. 3. Cholelithiasis without evidence of cholecystitis or biliary dilatation. 4. 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 Electronically Signed   By: Elsie Perone M.D.   On: 08/14/2023 10:35      Assessment/Plan History of SBO requiring surgical intervention SBO -CT abd/pel w/ contrast from 08/14/2023 showed recurrent diffuse small bowel dilation, suspicious for partial distal small bowel obstruction. No focal transition point or etiology identified. Fluid-filled structure with internal air in the central false pelvis appears contiguous with small bowel on the coronal images, and likely a postsurgical finding. Cholelithiasis without evidence of cholecystitis or biliary dilatation. 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 -Lipase 13 -Total bilirubin 1.6 -WBC 3.6 -UA negative -Afebrile. -Discussed  history, labs, and imaging with patient that is consistent with SBO. Reviewed imaging with MD. Will proceed with SBO protocol. NGT to be placed with NPO diet.   FEN: NPO; NGT to be placed; 0.9% sodium chloride  infusion at 150 mL/hr  VTE: Enoxaparin  ID: None   I reviewed ED provider notes, last 24 h vitals and pain scores, last 48 h intake and output, last 24 h labs and trends, and last 24 h imaging results.  This care required high  level of medical decision making.   Marjorie Carlyon Favre, [facility] 08/14/2023, 11:47 AM Please see Amion for pager number during day hours 7:00am-4:30pm

## 2023-08-14 NOTE — ED Triage Notes (Signed)
 C/o mid abdominal pain since yesterday. Hx of SBO w/ surgical intervention. Denies N/V/D. Small BM this morning.

## 2023-08-14 NOTE — H&P (Signed)
 History and Physical  Steven Owens [name] DOB: 1990/02/19 DOA: 08/14/2023  PCP: System, Provider Not In   Chief Complaint: Abdominal pain  HPI: [name] is a 33 y.o. male with medical history significant for abdominal surgery as an infant resulting in small bowel obstruction and surgical repair approximately 1 decade ago now being admitted to the hospital for 1 day of lower abdominal pain found to have partial small bowel obstruction.  Patient was admitted to the hospital in [facility] about a decade ago, from his description sounds like perhaps he had lysis of adhesions, he does not think any bowel was removed.  He is pretty healthy, does not take any regular medications starting yesterday morning he was having lower abdominal pain without radiation or associated fevers, nausea, or diarrhea.  He had a small bowel movement this morning.  He does feel bloated.  Although yesterday he did not have much of an appetite due to the abdominal pain.  He does not think he has been passing gas this morning.  He presented to the ER for evaluation and workup as detailed below shows evidence of partial small bowel obstruction.  Review of Systems: Please see HPI for pertinent positives and negatives. A complete 10 system review of systems are otherwise negative.  Past Medical History:  Diagnosis Date   SBO (small bowel obstruction) (HCC) 2018   Past Surgical History:  Procedure Laterality Date   ABDOMINAL SURGERY     Social History:  reports that he has been smoking cigarettes and cigars. He has never used smokeless tobacco. He reports current alcohol use. He reports that he does not currently use drugs.  No Known Allergies  History reviewed. No pertinent family history.   Prior to Admission medications   Medication Sig Start Date End Date Taking? Authorizing Provider  albuterol  (VENTOLIN  HFA) 108 (90 Base) MCG/ACT inhaler Inhale 1-2 puffs into the lungs every 6 (six) hours as needed.  01/05/22   Long, Fonda MATSU, MD  benzonatate  (TESSALON ) 100 MG capsule Take 1 capsule (100 mg total) by mouth 3 (three) times daily as needed for cough. 01/05/22   Long, Fonda MATSU, MD  fluticasone  (FLONASE ) 50 MCG/ACT nasal spray Place 2 sprays into both nostrils daily for 7 days. 01/05/22 01/12/22  Long, Fonda MATSU, MD  ibuprofen  (ADVIL ) 800 MG tablet Take 1 tablet (800 mg total) by mouth every 8 (eight) hours as needed for moderate pain. 01/05/22   Long, Fonda MATSU, MD    Physical Exam: BP 139/88 (BP Location: Right Arm)   Pulse 61   Temp 98 F (36.7 C) (Oral)   Resp 18   Ht 6' 1 (1.854 m)   Wt 99.8 kg   SpO2 94%   BMI 29.03 kg/m  General:  Alert, oriented, calm, in no acute distress, resting comfortably on room air. Cardiovascular: RRR, no murmurs or rubs, no peripheral edema  Respiratory: clear to auscultation bilaterally, no wheezes, no crackles  Abdomen: soft, tender to deep palpation in the bilateral lower quadrants, appears minimally distended Skin: dry, no rashes  Musculoskeletal: no joint effusions, normal range of motion  Psychiatric: appropriate affect, normal speech  Neurologic: extraocular muscles intact, clear speech, moving all extremities with intact sensorium         Labs on Admission:  Basic Metabolic Panel: Recent Labs  Lab 08/14/23 0724  NA 139  K 4.2  CL 102  CO2 25  GLUCOSE 104*  BUN 7  CREATININE 0.93  CALCIUM 9.2   Liver  Function Tests: Recent Labs  Lab 08/14/23 0724  AST 22  ALT 27  ALKPHOS 47  BILITOT 1.6*  PROT 6.9  ALBUMIN 4.5   Recent Labs  Lab 08/14/23 0724  LIPASE 13   No results for input(s): AMMONIA in the last 168 hours. CBC: Recent Labs  Lab 08/14/23 0724  WBC 3.6*  HGB 15.1  HCT 45.6  MCV 88.7  PLT 208   Cardiac Enzymes: No results for input(s): CKTOTAL, CKMB, CKMBINDEX, TROPONINI in the last 168 hours. BNP (last 3 results) No results for input(s): BNP in the last 8760 hours.  ProBNP (last 3 results) No  results for input(s): PROBNP in the last 8760 hours.  CBG: No results for input(s): GLUCAP in the last 168 hours.  Radiological Exams on Admission: CT ABDOMEN PELVIS W CONTRAST Result Date: 08/14/2023 CLINICAL DATA:  Abdominal pain, acute, nonlocalized Mid abdominal pain since yesterday. EXAM: CT ABDOMEN AND PELVIS WITH CONTRAST TECHNIQUE: Multidetector CT imaging of the abdomen and pelvis was performed using the standard protocol following bolus administration of intravenous contrast. RADIATION DOSE REDUCTION: This exam was performed according to the departmental dose-optimization program which includes automated exposure control, adjustment of the mA and/or kV according to patient size and/or use of iterative reconstruction technique. CONTRAST:  OMNIPAQUE  IOHEXOL  300 MG/ML  SOLN COMPARISON:  Abdominopelvic CT 04/01/2016. FINDINGS: Lower chest: Clear lung bases. No significant pleural or pericardial effusion. Hepatobiliary: The liver is normal in density without suspicious focal abnormality. Multiple calcified gallstones are noted. No evidence of gallbladder wall thickening or biliary dilatation. Pancreas: Unremarkable. No pancreatic ductal dilatation or surrounding inflammatory changes. 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 There are new thin septal calcifications within this lesion. No other focal splenic abnormality. Adrenals/Urinary Tract: Both adrenal glands appear normal. No evidence of urinary tract calculus, suspicious renal lesion or hydronephrosis. The bladder appears unremarkable for its degree of distention. Stomach/Bowel: No enteric contrast administered. The stomach appears unremarkable for its degree of distention. Small bowel anastomosis clips are again noted within the left mid abdomen, and there are multiple dilated loops of fluid-filled small bowel, slightly increased from the previous study. The terminal  ileum appears decompressed without focal transition point. A fluid-filled structure with internal air in the central false pelvis measures 7.3 x 5.2 cm, appearing contiguous with small bowel on the coronal images, and likely a postsurgical finding. The colon is decompressed. Vascular/Lymphatic: There are no enlarged abdominal or pelvic lymph nodes. No significant vascular findings. Reproductive: The prostate gland and seminal vesicles appear unremarkable. Other: Postsurgical changes in the anterior abdominal wall. No evidence of hernia, ascites or pneumoperitoneum. Musculoskeletal: No acute or significant osseous findings. IMPRESSION: 1. Recurrent diffuse small bowel dilatation, suspicious for partial distal small bowel obstruction. No focal transition point or etiology identified. 2. Fluid-filled structure with internal air in the central false pelvis appears contiguous with small bowel on the coronal images, and likely a postsurgical finding. 3. Cholelithiasis without evidence of cholecystitis or biliary dilatation. 4. 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 Electronically Signed   By: Elsie Perone M.D.   On: 08/14/2023 10:35   Assessment/Plan Joevanni Brittian is a 33 y.o. male with medical history significant for abdominal surgery as an infant resulting in small bowel obstruction and surgical repair approximately 1 decade ago now being admitted to the hospital for 1 day of lower abdominal pain found to have partial small  bowel obstruction.   Partial small bowel obstruction-with abdominal pain for the last 24 hours, without obvious transition point or etiology seen on CT scan.  Patient is currently comfortable without nausea. -Inpatient admission -N.p.o. -Pain and nausea medication -IV fluids -General Surgery consulted -Consider NG tube in case of vomiting  Hypertension-I suspect currently elevated due to discomfort, will monitor closely  DVT prophylaxis: Lovenox       Code Status: Full Code  Consults called: None  Admission status: The appropriate patient status for this patient is INPATIENT. Inpatient status is judged to be reasonable and necessary in order to provide the required intensity of service to ensure the patient's safety. The patient's presenting symptoms, physical exam findings, and initial radiographic and laboratory data in the context of their chronic comorbidities is felt to place them at high risk for further clinical deterioration. Furthermore, it is not anticipated that the patient will be medically stable for discharge from the hospital within 2 midnights of admission.    I certify that at the point of admission it is my clinical judgment that the patient will require inpatient hospital care spanning beyond 2 midnights from the point of admission due to high intensity of service, high risk for further deterioration and high frequency of surveillance required  Time spent: 53 minutes  Solei Wubben CHRISTELLA [name] MD [facility] Pager [phone]  If 7PM-7AM, please contact night-coverage [email or web address] Password [email or web address]  08/14/2023, 1:02 PM

## 2023-08-15 DIAGNOSIS — R109 Unspecified abdominal pain: Secondary | ICD-10-CM

## 2023-08-15 DIAGNOSIS — K56600 Partial intestinal obstruction, unspecified as to cause: Secondary | ICD-10-CM

## 2023-08-15 LAB — BASIC METABOLIC PANEL WITH GFR
Anion gap: 15 (ref 5–15)
BUN: 10 mg/dL (ref 6–20)
CO2: 18 mmol/L — ABNORMAL LOW (ref 22–32)
Calcium: 8.2 mg/dL — ABNORMAL LOW (ref 8.9–10.3)
Chloride: 101 mmol/L (ref 98–111)
Creatinine, Ser: 0.89 mg/dL (ref 0.61–1.24)
GFR, Estimated: >60 mL/min (ref >60)
Glucose, Bld: 64 mg/dL — ABNORMAL LOW (ref 70–99)
Potassium: 4.7 mmol/L (ref 3.5–5.1)
Sodium: 134 mmol/L — ABNORMAL LOW (ref 135–145)

## 2023-08-15 LAB — CBC
HCT: 46.4 % (ref 39.0–52.0)
Hemoglobin: 14.6 g/dL (ref 13.0–17.0)
MCH: 29.2 pg (ref 26.0–34.0)
MCHC: 31.5 g/dL (ref 30.0–36.0)
MCV: 92.8 fL (ref 80.0–100.0)
Platelets: 196 K/uL (ref 150–400)
RBC: 5 MIL/uL (ref 4.22–5.81)
RDW: 13 % (ref 11.5–15.5)
WBC: 3.3 K/uL — ABNORMAL LOW (ref 4.0–10.5)
nRBC: 0 % (ref 0.0–0.2)

## 2023-08-15 LAB — HIV ANTIBODY (ROUTINE TESTING W REFLEX): HIV Screen 4th Generation wRfx: NONREACTIVE

## 2023-08-15 NOTE — Hospital Course (Signed)
 33 y.o. male with medical history significant for abdominal surgery as an infant resulting in small bowel obstruction and surgical repair approximately 1 decade ago now being admitted to the hospital for 1 day of lower abdominal pain found to have partial small bowel obstruction.  Patient was admitted to the hospital in [facility] about a decade ago, from his description sounds like perhaps he had lysis of adhesions, he does not think any bowel was removed.  He is pretty healthy, does not take any regular medications starting yesterday morning he was having lower abdominal pain without radiation or associated fevers, nausea, or diarrhea.  He had a small bowel movement this morning.  He does feel bloated.  Although yesterday he did not have much of an appetite due to the abdominal pain.  He does not think he has been passing gas this morning.  He presented to the ER for evaluation and workup as detailed below shows evidence of partial small bowel obstruction.

## 2023-08-15 NOTE — Progress Notes (Signed)
  Progress Note   Patient: Steven Owens [name] DOB: July 10, 1990 DOA: 08/14/2023     1 DOS: the patient was seen and examined on 08/15/2023   Brief hospital course: 33 y.o. male with medical history significant for abdominal surgery as an infant resulting in small bowel obstruction and surgical repair approximately 1 decade ago now being admitted to the hospital for 1 day of lower abdominal pain found to have partial small bowel obstruction.  Patient was admitted to the hospital in [facility] about a decade ago, from his description sounds like perhaps he had lysis of adhesions, he does not think any bowel was removed.  He is pretty healthy, does not take any regular medications starting yesterday morning he was having lower abdominal pain without radiation or associated fevers, nausea, or diarrhea.  He had a small bowel movement this morning.  He does feel bloated.  Although yesterday he did not have much of an appetite due to the abdominal pain.  He does not think he has been passing gas this morning.  He presented to the ER for evaluation and workup as detailed below shows evidence of partial small bowel obstruction.   Assessment and Plan: Partial small bowel obstruction-with abdominal pain for the last 24 hours, without obvious transition point or etiology seen on CT scan.  P -NG in place this AM -Pain and nausea medication as needed -General Surgery consulted. Recs for clamping trial of NG with clears --contrast noted in rectum on SBO protocol study -encourage ambulation as tolerated   Hypertension- -likely secondary to feeling uncomfortable -Cont to monitor for now   Subjective: Without complaints this AM  Physical Exam: Vitals:   08/14/23 2132 08/15/23 0621 08/15/23 1006 08/15/23 1355  BP: (!) 143/86 (!) 143/90 (!) 153/84 (!) 164/85  Pulse: 65 90 66 70  Resp: 15 18 18 18   Temp: 98.4 F (36.9 C) 98.2 F (36.8 C) 98.4 F (36.9 C) 97.7 F (36.5 C)  TempSrc: Oral Oral Oral Oral   SpO2: 100% 100% 99% 99%  Weight:      Height:       General exam: Awake, laying in bed, in nad Respiratory system: Normal respiratory effort, no wheezing Cardiovascular system: regular rate, s1, s2 Gastrointestinal system: Soft, distended, decreased BS Central nervous system: CN2-12 grossly intact, strength intact Extremities: Perfused, no clubbing Skin: Normal skin turgor, no notable skin lesions seen Psychiatry: Mood normal // no visual hallucinations   Data Reviewed:  Labs reviewed: Na Na 134, K 4.7, Cr 0.89, Hgb 14.6   Family Communication: Pt in room, family not at bedside  Disposition: Status is: Inpatient Remains inpatient appropriate because: severity of illness  Planned Discharge Destination: Home     Author: Garnette Pelt, MD 08/15/2023 6:30 PM  For on call review [email or web address].

## 2023-08-15 NOTE — TOC Initial Note (Signed)
 Transition of Care [facility]) - Initial/Assessment Note   Patient Details  Name: Steven Owens MRN: [id number] Date of Birth: 21-Sep-1990  [facility]) CM/SW Contact:    Duwaine GORMAN Aran, LCSW Phone Number: 08/15/2023, 1:03 PM  Clinical Narrative: Patient does not have a PCP and is uninsured. Patient agreeable to CSW providing him with a list of PCPs. CSW provided list to patient.  Expected Discharge Plan: Home/Self Care Barriers to Discharge: Continued Medical Work up  Patient Goals and CMS Choice Patient states their goals for this hospitalization and ongoing recovery are:: Home Choice offered to / list presented to : NA  Expected Discharge Plan and Services In-house Referral: Clinical Social Work Post Acute Care Choice: NA Living arrangements for the past 2 months: Apartment            DME Arranged: N/A DME Agency: NA  Prior Living Arrangements/Services Living arrangements for the past 2 months: Apartment Patient language and need for interpreter reviewed:: Yes Do you feel safe going back to the place where you live?: Yes      Need for Family Participation in Patient Care: No (Comment) Care giver support system in place?: Yes (comment) Criminal Activity/Legal Involvement Pertinent to Current Situation/Hospitalization: No - Comment as needed  Activities of Daily Living ADL Screening (condition at time of admission) Independently performs ADLs?: Yes (appropriate for developmental age) Is the patient deaf or have difficulty hearing?: No Does the patient have difficulty seeing, even when wearing glasses/contacts?: No Does the patient have difficulty concentrating, remembering, or making decisions?: No  Emotional Assessment Attitude/Demeanor/Rapport: Engaged Affect (typically observed): Accepting Orientation: : Oriented to Self, Oriented to Place, Oriented to  Time, Oriented to Situation Alcohol / Substance Use: Not Applicable Psych Involvement: No (comment)  Admission  diagnosis:  SBO (small bowel obstruction) (HCC) [K56.609] Abdominal pain, unspecified abdominal location [R10.9] Partial intestinal obstruction, unspecified cause (HCC) [K56.600] Patient Active Problem List   Diagnosis Date Noted   SBO (small bowel obstruction) (HCC) 08/14/2023   PCP:  System, Provider Not In Pharmacy:   [facility] [phone] - [place], [place] - [address] [place] [address] Phone: [phone] Fax: [phone]  [place] [facility] [address] [place] [address] Phone: [phone] Fax: [phone]  Social Drivers of Health (SDOH) Social History: SDOH Screenings   Food Insecurity: Patient Declined (08/14/2023)  Housing: Patient Declined (08/14/2023)  Transportation Needs: Patient Declined (08/14/2023)  Utilities: Patient Declined (08/14/2023)  Tobacco Use: High Risk (08/14/2023)   SDOH Interventions:    Readmission Risk Interventions     No data to display

## 2023-08-15 NOTE — Progress Notes (Signed)
 Progress Note     Subjective: Patient reports generalized abdominal pain only with movement. Has not had a bowel movement or flatulence. Denies nausea and vomiting.   ROS  All negative with the exception of above.  Objective: Vital signs in last 24 hours: Temp:  [98 F (36.7 C)-98.4 F (36.9 C)] 98.2 F (36.8 C) (08/06 0621) Pulse Rate:  [53-90] 90 (08/06 0621) Resp:  [15-18] 18 (08/06 0621) BP: (133-145)/(74-91) 143/90 (08/06 0621) SpO2:  [92 %-100 %] 100 % (08/06 0621) Last BM Date : 08/13/23  Intake/Output from previous day: 08/05 0701 - 08/06 0700 In: 2455.4 [I.V.:2455.4] Out: 600 [Urine:350; Emesis/NG output:250] Intake/Output this shift: No intake/output data recorded.  PE: General: Pleasant male who is laying in bed in NAD Lungs: Respiratory effort nonlabored Abd: Soft and ND. Generalized tenderness to deep palpation that is more prominent in the RUQ and RLQ. Midline scarring present from previous procedures. No rebound tenderness or guarding.  Psych: A&Ox3 with an appropriate affect.    Lab Results:  Recent Labs    08/14/23 0724 08/15/23 0456  WBC 3.6* 3.3*  HGB 15.1 14.6  HCT 45.6 46.4  PLT 208 196   BMET Recent Labs    08/14/23 0724 08/15/23 0456  NA 139 134*  K 4.2 4.7  CL 102 101  CO2 25 18*  GLUCOSE 104* 64*  BUN 7 10  CREATININE 0.93 0.89  CALCIUM 9.2 8.2*   PT/INR No results for input(s): LABPROT, INR in the last 72 hours. CMP     Component Value Date/Time   NA 134 (L) 08/15/2023 0456   K 4.7 08/15/2023 0456   CL 101 08/15/2023 0456   CO2 18 (L) 08/15/2023 0456   GLUCOSE 64 (L) 08/15/2023 0456   BUN 10 08/15/2023 0456   CREATININE 0.89 08/15/2023 0456   CALCIUM 8.2 (L) 08/15/2023 0456   PROT 6.9 08/14/2023 0724   ALBUMIN 4.5 08/14/2023 0724   AST 22 08/14/2023 0724   ALT 27 08/14/2023 0724   ALKPHOS 47 08/14/2023 0724   BILITOT 1.6 (H) 08/14/2023 0724   GFRNONAA >60 08/15/2023 0456   Lipase     Component Value  Date/Time   LIPASE 13 08/14/2023 0724       Studies/Results: DG Abd Portable 1V-Small Bowel Obstruction Protocol-initial, 8 hr delay Result Date: 08/15/2023 CLINICAL DATA:  Small bowel protocol, 8 hours post contrast EXAM: PORTABLE ABDOMEN - 1 VIEW COMPARISON:  CT abdomen pelvis 08/14/2023, x-ray abdomen 08/14/2023 2:49 p.m. FINDINGS: Enteric tube with tip overlying the expected region of the gastric lumen. PO contrast reaches the rectum. The bowel gas pattern is normal. No radio-opaque calculi or other significant radiographic abnormality are seen. IMPRESSION: 1. PO contrast reaches the rectum. 2. Nonobstructive bowel gas pattern. Electronically Signed   By: Morgane  Naveau M.D.   On: 08/15/2023 00:11   DG Abd Portable 1V-Small Bowel Protocol-Position Verification Result Date: 08/14/2023 CLINICAL DATA:  NG tube placement EXAM: PORTABLE ABDOMEN - 1 VIEW COMPARISON:  CT today FINDINGS: NG tube tip is in the mid stomach.  Visualized lungs clear. IMPRESSION: NG tube in the mid stomach. Electronically Signed   By: Franky Crease M.D.   On: 08/14/2023 15:19   CT ABDOMEN PELVIS W CONTRAST Result Date: 08/14/2023 CLINICAL DATA:  Abdominal pain, acute, nonlocalized Mid abdominal pain since yesterday. EXAM: CT ABDOMEN AND PELVIS WITH CONTRAST TECHNIQUE: Multidetector CT imaging of the abdomen and pelvis was performed using the standard protocol following bolus administration of intravenous contrast. RADIATION  DOSE REDUCTION: This exam was performed according to the departmental dose-optimization program which includes automated exposure control, adjustment of the mA and/or kV according to patient size and/or use of iterative reconstruction technique. CONTRAST:  OMNIPAQUE  IOHEXOL  300 MG/ML  SOLN COMPARISON:  Abdominopelvic CT 04/01/2016. FINDINGS: Lower chest: Clear lung bases. No significant pleural or pericardial effusion. Hepatobiliary: The liver is normal in density without suspicious focal abnormality.  Multiple calcified gallstones are noted. No evidence of gallbladder wall thickening or biliary dilatation. Pancreas: Unremarkable. No pancreatic ductal dilatation or surrounding inflammatory changes. 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 There are new thin septal calcifications within this lesion. No other focal splenic abnormality. Adrenals/Urinary Tract: Both adrenal glands appear normal. No evidence of urinary tract calculus, suspicious renal lesion or hydronephrosis. The bladder appears unremarkable for its degree of distention. Stomach/Bowel: No enteric contrast administered. The stomach appears unremarkable for its degree of distention. Small bowel anastomosis clips are again noted within the left mid abdomen, and there are multiple dilated loops of fluid-filled small bowel, slightly increased from the previous study. The terminal ileum appears decompressed without focal transition point. A fluid-filled structure with internal air in the central false pelvis measures 7.3 x 5.2 cm, appearing contiguous with small bowel on the coronal images, and likely a postsurgical finding. The colon is decompressed. Vascular/Lymphatic: There are no enlarged abdominal or pelvic lymph nodes. No significant vascular findings. Reproductive: The prostate gland and seminal vesicles appear unremarkable. Other: Postsurgical changes in the anterior abdominal wall. No evidence of hernia, ascites or pneumoperitoneum. Musculoskeletal: No acute or significant osseous findings. IMPRESSION: 1. Recurrent diffuse small bowel dilatation, suspicious for partial distal small bowel obstruction. No focal transition point or etiology identified. 2. Fluid-filled structure with internal air in the central false pelvis appears contiguous with small bowel on the coronal images, and likely a postsurgical finding. 3. Cholelithiasis without evidence of cholecystitis or biliary  dilatation. 4. 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 Electronically Signed   By: Elsie Perone M.D.   On: 08/14/2023 10:35    Anti-infectives: Anti-infectives (From admission, onward)    None        Assessment/Plan History of SBO requiring surgical intervention SBO -CT abd/pel w/ contrast from 08/14/2023 showed recurrent diffuse small bowel dilation, suspicious for partial distal small bowel obstruction. No focal transition point or etiology identified. Fluid-filled structure with internal air in the central false pelvis appears contiguous with small bowel on the coronal images, and likely a postsurgical finding. Cholelithiasis without evidence of cholecystitis or biliary dilatation. 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 -Lipase 13 on 08/14/2023 -Total bilirubin 1.6 on 08/14/2023 -WBC 3.3 from 3.6 -Afebrile. Hypertensive. -SBO protocol on 08/15/2023 with NPO/NGT. NGT output 250. DG Abd port 1v-SBO protocol showed PO contrast reaching the rectum. Normal bowel gas pattern. -Will discuss advancing diet with MD as tolerated.    FEN: NPO; NGT; 0.9% sodium chloride  infusion at 150 mL/hr  VTE: Enoxaparin  ID: None   LOS: 1 day   I reviewed nursing notes, ED provider notes, last 24 h vitals and pain scores, last 48 h intake and output, last 24 h labs and trends, and last 24 h imaging results.  This care required moderate level of medical decision making.    Marjorie Carlyon Favre, [facility] 08/15/2023, 8:45 AM Please see Amion for pager number during day hours  7:00am-4:30pm

## 2023-08-16 LAB — CBC
HCT: 47.6 % (ref 39.0–52.0)
Hemoglobin: 15.1 g/dL (ref 13.0–17.0)
MCH: 29.6 pg (ref 26.0–34.0)
MCHC: 31.7 g/dL (ref 30.0–36.0)
MCV: 93.3 fL (ref 80.0–100.0)
Platelets: 219 K/uL (ref 150–400)
RBC: 5.1 MIL/uL (ref 4.22–5.81)
RDW: 12.7 % (ref 11.5–15.5)
WBC: 2.8 K/uL — ABNORMAL LOW (ref 4.0–10.5)
nRBC: 0 % (ref 0.0–0.2)

## 2023-08-16 LAB — COMPREHENSIVE METABOLIC PANEL WITH GFR
ALT: 24 U/L (ref 0–44)
AST: 20 U/L (ref 15–41)
Albumin: 4 g/dL (ref 3.5–5.0)
Alkaline Phosphatase: 46 U/L (ref 38–126)
Anion gap: 14 (ref 5–15)
BUN: 12 mg/dL (ref 6–20)
CO2: 21 mmol/L — ABNORMAL LOW (ref 22–32)
Calcium: 9.1 mg/dL (ref 8.9–10.3)
Chloride: 104 mmol/L (ref 98–111)
Creatinine, Ser: 0.94 mg/dL (ref 0.61–1.24)
GFR, Estimated: >60 mL/min (ref >60)
Glucose, Bld: 73 mg/dL (ref 70–99)
Potassium: 4.2 mmol/L (ref 3.5–5.1)
Sodium: 139 mmol/L (ref 135–145)
Total Bilirubin: 1.6 mg/dL — ABNORMAL HIGH (ref 0.0–1.2)
Total Protein: 7.2 g/dL (ref 6.5–8.1)

## 2023-08-16 NOTE — Progress Notes (Signed)
  Progress Note   Patient: Steven Owens [name] DOB: 07/28/1990 DOA: 08/14/2023     2 DOS: the patient was seen and examined on 08/16/2023   Brief hospital course: 33 y.o. male with medical history significant for abdominal surgery as an infant resulting in small bowel obstruction and surgical repair approximately 1 decade ago now being admitted to the hospital for 1 day of lower abdominal pain found to have partial small bowel obstruction.  Patient was admitted to the hospital in [facility] about a decade ago, from his description sounds like perhaps he had lysis of adhesions, he does not think any bowel was removed.  He is pretty healthy, does not take any regular medications starting yesterday morning he was having lower abdominal pain without radiation or associated fevers, nausea, or diarrhea.  He had a small bowel movement this morning.  He does feel bloated.  Although yesterday he did not have much of an appetite due to the abdominal pain.  He does not think he has been passing gas this morning.  He presented to the ER for evaluation and workup as detailed below shows evidence of partial small bowel obstruction.   Assessment and Plan: Partial small bowel obstruction-with abdominal pain for the last 24 hours, without obvious transition point or etiology seen on CT scan.   -Pain and nausea medication as needed -General Surgery following. Slowly improving. Was on clears, now advanced to full liquids -continue ambulation as tolerated   Hypertension- -likely secondary to feeling uncomfortable -Cont to monitor for now   Subjective: Still having some abd discomfort  Physical Exam: Vitals:   08/15/23 1355 08/15/23 2010 08/16/23 0501 08/16/23 1313  BP: (!) 164/85 (!) 141/68 134/80 137/79  Pulse: 70 66 61 (!) 58  Resp: 18 18 18 16   Temp: 97.7 F (36.5 C) 98.5 F (36.9 C) 97.7 F (36.5 C) 98.2 F (36.8 C)  TempSrc: Oral Oral Oral Oral  SpO2: 99% 100% 100% 100%  Weight:      Height:        General exam: Conversant, in no acute distress Respiratory system: normal chest rise, clear, no audible wheezing Cardiovascular system: regular rhythm, s1-s2 Gastrointestinal system: Nondistended, nontender, pos BS Central nervous system: No seizures, no tremors Extremities: No cyanosis, no joint deformities Skin: No rashes, no pallor Psychiatry: Affect normal // no auditory hallucinations   Data Reviewed:  Labs reviewed: Na 139, K 4.2, Cr 0.94, WBC 2.8, Hgb 15.1, Plts 219   Family Communication: Pt in room, family not at bedside  Disposition: Status is: Inpatient Remains inpatient appropriate because: severity of illness  Planned Discharge Destination: Home     Author: Garnette Pelt, MD 08/16/2023 5:11 PM  For on call review [email or web address].

## 2023-08-16 NOTE — Progress Notes (Signed)
 Progress Note     Subjective: Patient reports continued abdominal discomfort. He experience cramping sensation with trial of Jello. Accordingly, he has only had sips of water since then. He has not had any nausea or vomiting since removal of NGT. Had 3 bowel movements yesterday and 1 bowel movement this morning without hematochezia. Denies flatulence.   ROS  All negative with the exception of above.  Objective: Vital signs in last 24 hours: Temp:  [97.7 F (36.5 C)-98.5 F (36.9 C)] 97.7 F (36.5 C) (08/07 0501) Pulse Rate:  [61-70] 61 (08/07 0501) Resp:  [18] 18 (08/07 0501) BP: (134-164)/(68-85) 134/80 (08/07 0501) SpO2:  [99 %-100 %] 100 % (08/07 0501) Last BM Date : 08/13/23  Intake/Output from previous day: 08/06 0701 - 08/07 0700 In: 1080 [P.O.:1080] Out: 450 [Emesis/NG output:450] Intake/Output this shift: No intake/output data recorded.  PE: General: Pleasant male who is laying in bed in NAD. Lungs: Respiratory effort nonlabored Abd: Soft with mild distention. Mild tenderness to epigastric region with deep palpation. Midline scarring present from previous procedures. No rebound tenderness or guarding.  Psych: A&Ox3 with an appropriate affect.    Lab Results:  Recent Labs    08/14/23 0724 08/15/23 0456  WBC 3.6* 3.3*  HGB 15.1 14.6  HCT 45.6 46.4  PLT 208 196   BMET Recent Labs    08/14/23 0724 08/15/23 0456  NA 139 134*  K 4.2 4.7  CL 102 101  CO2 25 18*  GLUCOSE 104* 64*  BUN 7 10  CREATININE 0.93 0.89  CALCIUM 9.2 8.2*   PT/INR No results for input(s): LABPROT, INR in the last 72 hours. CMP     Component Value Date/Time   NA 134 (L) 08/15/2023 0456   K 4.7 08/15/2023 0456   CL 101 08/15/2023 0456   CO2 18 (L) 08/15/2023 0456   GLUCOSE 64 (L) 08/15/2023 0456   BUN 10 08/15/2023 0456   CREATININE 0.89 08/15/2023 0456   CALCIUM 8.2 (L) 08/15/2023 0456   PROT 6.9 08/14/2023 0724   ALBUMIN 4.5 08/14/2023 0724   AST 22 08/14/2023  0724   ALT 27 08/14/2023 0724   ALKPHOS 47 08/14/2023 0724   BILITOT 1.6 (H) 08/14/2023 0724   GFRNONAA >60 08/15/2023 0456   Lipase     Component Value Date/Time   LIPASE 13 08/14/2023 0724       Studies/Results: DG Abd Portable 1V-Small Bowel Obstruction Protocol-initial, 8 hr delay Result Date: 08/15/2023 CLINICAL DATA:  Small bowel protocol, 8 hours post contrast EXAM: PORTABLE ABDOMEN - 1 VIEW COMPARISON:  CT abdomen pelvis 08/14/2023, x-ray abdomen 08/14/2023 2:49 p.m. FINDINGS: Enteric tube with tip overlying the expected region of the gastric lumen. PO contrast reaches the rectum. The bowel gas pattern is normal. No radio-opaque calculi or other significant radiographic abnormality are seen. IMPRESSION: 1. PO contrast reaches the rectum. 2. Nonobstructive bowel gas pattern. Electronically Signed   By: Morgane  Naveau M.D.   On: 08/15/2023 00:11   DG Abd Portable 1V-Small Bowel Protocol-Position Verification Result Date: 08/14/2023 CLINICAL DATA:  NG tube placement EXAM: PORTABLE ABDOMEN - 1 VIEW COMPARISON:  CT today FINDINGS: NG tube tip is in the mid stomach.  Visualized lungs clear. IMPRESSION: NG tube in the mid stomach. Electronically Signed   By: Franky Crease M.D.   On: 08/14/2023 15:19   CT ABDOMEN PELVIS W CONTRAST Result Date: 08/14/2023 CLINICAL DATA:  Abdominal pain, acute, nonlocalized Mid abdominal pain since yesterday. EXAM: CT ABDOMEN AND PELVIS  WITH CONTRAST TECHNIQUE: Multidetector CT imaging of the abdomen and pelvis was performed using the standard protocol following bolus administration of intravenous contrast. RADIATION DOSE REDUCTION: This exam was performed according to the departmental dose-optimization program which includes automated exposure control, adjustment of the mA and/or kV according to patient size and/or use of iterative reconstruction technique. CONTRAST:  OMNIPAQUE  IOHEXOL  300 MG/ML  SOLN COMPARISON:  Abdominopelvic CT 04/01/2016. FINDINGS:  Lower chest: Clear lung bases. No significant pleural or pericardial effusion. Hepatobiliary: The liver is normal in density without suspicious focal abnormality. Multiple calcified gallstones are noted. No evidence of gallbladder wall thickening or biliary dilatation. Pancreas: Unremarkable. No pancreatic ductal dilatation or surrounding inflammatory changes. 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 There are new thin septal calcifications within this lesion. No other focal splenic abnormality. Adrenals/Urinary Tract: Both adrenal glands appear normal. No evidence of urinary tract calculus, suspicious renal lesion or hydronephrosis. The bladder appears unremarkable for its degree of distention. Stomach/Bowel: No enteric contrast administered. The stomach appears unremarkable for its degree of distention. Small bowel anastomosis clips are again noted within the left mid abdomen, and there are multiple dilated loops of fluid-filled small bowel, slightly increased from the previous study. The terminal ileum appears decompressed without focal transition point. A fluid-filled structure with internal air in the central false pelvis measures 7.3 x 5.2 cm, appearing contiguous with small bowel on the coronal images, and likely a postsurgical finding. The colon is decompressed. Vascular/Lymphatic: There are no enlarged abdominal or pelvic lymph nodes. No significant vascular findings. Reproductive: The prostate gland and seminal vesicles appear unremarkable. Other: Postsurgical changes in the anterior abdominal wall. No evidence of hernia, ascites or pneumoperitoneum. Musculoskeletal: No acute or significant osseous findings. IMPRESSION: 1. Recurrent diffuse small bowel dilatation, suspicious for partial distal small bowel obstruction. No focal transition point or etiology identified. 2. Fluid-filled structure with internal air in the central false  pelvis appears contiguous with small bowel on the coronal images, and likely a postsurgical finding. 3. Cholelithiasis without evidence of cholecystitis or biliary dilatation. 4. 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 Electronically Signed   By: Elsie Perone M.D.   On: 08/14/2023 10:35    Anti-infectives: Anti-infectives (From admission, onward)    None        Assessment/Plan History of SBO requiring surgical intervention SBO -CT abd/pel w/ contrast from 08/14/2023 showed recurrent diffuse small bowel dilation, suspicious for partial distal small bowel obstruction. No focal transition point or etiology identified. Fluid-filled structure with internal air in the central false pelvis appears contiguous with small bowel on the coronal images, and likely a postsurgical finding. Cholelithiasis without evidence of cholecystitis or biliary dilatation. 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 -Lipase 13 on 08/14/2023 -Total bilirubin 1.6 on 08/14/2023 -WBC 3.3  on 08/15/2023 -CMP and CBC in progress -Afebrile. Vitals stable -SBO protocol on 08/15/2023 with NPO/NGT. DG Abd port 1v-SBO protocol showed PO contrast reaching the rectum. Normal bowel gas pattern. NGT tube removed 08/15/2023 -No emesis or nausea since removal of NGT tube. Cramping with CLD. Bowel function present. Continue with CLD. Considering advancing this afternoon.   FEN: CLD VTE: Enoxaparin  ID: None     LOS: 2 days   I reviewed hospitalist notes, last 24 h vitals and pain scores, last 48 h intake and output, last 24 h labs and trends, and last 24 h  imaging results.  This care required moderate level of medical decision making.    Marjorie Carlyon Favre, [facility] 08/16/2023, 9:46 AM Please see Amion for pager number during day hours 7:00am-4:30pm

## 2023-08-17 LAB — CBC
HCT: 46.3 % (ref 39.0–52.0)
Hemoglobin: 14.5 g/dL (ref 13.0–17.0)
MCH: 28.7 pg (ref 26.0–34.0)
MCHC: 31.3 g/dL (ref 30.0–36.0)
MCV: 91.7 fL (ref 80.0–100.0)
Platelets: 202 K/uL (ref 150–400)
RBC: 5.05 MIL/uL (ref 4.22–5.81)
RDW: 12.5 % (ref 11.5–15.5)
WBC: 2.7 K/uL — ABNORMAL LOW (ref 4.0–10.5)
nRBC: 0 % (ref 0.0–0.2)

## 2023-08-17 LAB — COMPREHENSIVE METABOLIC PANEL WITH GFR
ALT: 21 U/L (ref 0–44)
AST: 17 U/L (ref 15–41)
Albumin: 3.7 g/dL (ref 3.5–5.0)
Alkaline Phosphatase: 42 U/L (ref 38–126)
Anion gap: 13 (ref 5–15)
BUN: 13 mg/dL (ref 6–20)
CO2: 23 mmol/L (ref 22–32)
Calcium: 8.8 mg/dL — ABNORMAL LOW (ref 8.9–10.3)
Chloride: 100 mmol/L (ref 98–111)
Creatinine, Ser: 0.99 mg/dL (ref 0.61–1.24)
GFR, Estimated: >60 mL/min (ref >60)
Glucose, Bld: 73 mg/dL (ref 70–99)
Potassium: 3.9 mmol/L (ref 3.5–5.1)
Sodium: 136 mmol/L (ref 135–145)
Total Bilirubin: 1.2 mg/dL (ref 0.0–1.2)
Total Protein: 6.7 g/dL (ref 6.5–8.1)

## 2023-08-17 NOTE — Plan of Care (Signed)
  Problem: Education: Goal: Knowledge of General Education information will improve Description: Including pain rating scale, medication(s)/side effects and non-pharmacologic comfort measures Outcome: Progressing   Problem: Health Behavior/Discharge Planning: Goal: Ability to manage health-related needs will improve Outcome: Progressing   Problem: Pain Managment: Goal: General experience of comfort will improve and/or be controlled Outcome: Progressing   Problem: Safety: Goal: Ability to remain free from injury will improve Outcome: Progressing

## 2023-08-17 NOTE — Progress Notes (Signed)
  Progress Note   Patient: Steven Owens [name] DOB: [birth year] DOA: 08/14/2023     3 DOS: the patient was seen and examined on 08/17/2023   Brief hospital course: 33 y.o. male with medical history significant for abdominal surgery as an infant resulting in small bowel obstruction and surgical repair approximately 1 decade ago now being admitted to the hospital for 1 day of lower abdominal pain found to have partial small bowel obstruction.  Patient was admitted to the hospital in [facility] about a decade ago, from his description sounds like perhaps he had lysis of adhesions, he does not think any bowel was removed.  He is pretty healthy, does not take any regular medications starting yesterday morning he was having lower abdominal pain without radiation or associated fevers, nausea, or diarrhea.  He had a small bowel movement this morning.  He does feel bloated.  Although yesterday he did not have much of an appetite due to the abdominal pain.  He does not think he has been passing gas this morning.  He presented to the ER for evaluation and workup as detailed below shows evidence of partial small bowel obstruction.   Assessment and Plan: Partial small bowel obstruction-with abdominal pain for the last 24 hours, without obvious transition point or etiology seen on CT scan.   -Pain and nausea medication as needed -General Surgery following. Slowly improving. Was on clears, now advanced to soft diet -continue ambulation as tolerated. Surgery continues to follow   Hypertension- -likely secondary to feeling uncomfortable -Cont to monitor for now   Subjective: Ambulating in hallway. Still having some lower abd cramping  Physical Exam: Vitals:   08/16/23 1313 08/16/23 2047 08/17/23 0528 08/17/23 1303  BP: 137/79 (!) 140/77 137/75 139/84  Pulse: (!) 58 (!) 50 (!) 59 (!) 57  Resp: 16 18 16 16   Temp: 98.2 F (36.8 C) 97.8 F (36.6 C) 97.8 F (36.6 C) 98 F (36.7 C)  TempSrc: Oral Oral Oral  Oral  SpO2: 100% 100% 100% 100%  Weight:      Height:       General exam: Awake, laying in bed, in nad Respiratory system: Normal respiratory effort, no wheezing Cardiovascular system: regular rate, s1, s2 Gastrointestinal system: Soft, nondistended, positive BS Central nervous system: CN2-12 grossly intact, strength intact Extremities: Perfused, no clubbing Skin: Normal skin turgor, no notable skin lesions seen Psychiatry: Mood normal // no visual hallucinations   Data Reviewed:  Labs reviewed: Na 136, K 3.9, Cr 0.99, WBC 2.7, Hgb 14.5, Plts 202   Family Communication: Pt in room, family not at bedside  Disposition: Status is: Inpatient Remains inpatient appropriate because: severity of illness  Planned Discharge Destination: Home     Author: Garnette Pelt, MD 08/17/2023 5:19 PM  For on call review [email or web address].

## 2023-08-17 NOTE — Plan of Care (Signed)

## 2023-08-17 NOTE — Progress Notes (Signed)
 Progress Note     Subjective: Patient reports that he tolerated FLD. Reports some cramping after intake that dissipated. Denies nausea, vomiting, significant pain after intake. Patient had 2 bowel movements yesterday that were loose. Denies hematochezia. Reports flatulence.  ROS  All negative with the exception of above.  Objective: Vital signs in last 24 hours: Temp:  [97.8 F (36.6 C)-98.2 F (36.8 C)] 97.8 F (36.6 C) (08/08 0528) Pulse Rate:  [50-59] 59 (08/08 0528) Resp:  [16-18] 16 (08/08 0528) BP: (137-140)/(75-79) 137/75 (08/08 0528) SpO2:  [100 %] 100 % (08/08 0528) Last BM Date : 08/13/23  Intake/Output from previous day: 08/07 0701 - 08/08 0700 In: 720 [P.O.:720] Out: -  Intake/Output this shift: No intake/output data recorded.  PE: General: Pleasant male who is laying in bed in NAD. Lungs: Respiratory effort nonlabored Abd: Soft without distention. Mild tenderness to epigastric region and RLQ with deep palpation. Midline scarring present from previous procedures. No rebound tenderness or guarding.  Psych: A&Ox3 with an appropriate affect.    Lab Results:  Recent Labs    08/16/23 0927 08/17/23 0412  WBC 2.8* 2.7*  HGB 15.1 14.5  HCT 47.6 46.3  PLT 219 202   BMET Recent Labs    08/16/23 0927 08/17/23 0412  NA 139 136  K 4.2 3.9  CL 104 100  CO2 21* 23  GLUCOSE 73 73  BUN 12 13  CREATININE 0.94 0.99  CALCIUM 9.1 8.8*   PT/INR No results for input(s): LABPROT, INR in the last 72 hours. CMP     Component Value Date/Time   NA 136 08/17/2023 0412   K 3.9 08/17/2023 0412   CL 100 08/17/2023 0412   CO2 23 08/17/2023 0412   GLUCOSE 73 08/17/2023 0412   BUN 13 08/17/2023 0412   CREATININE 0.99 08/17/2023 0412   CALCIUM 8.8 (L) 08/17/2023 0412   PROT 6.7 08/17/2023 0412   ALBUMIN 3.7 08/17/2023 0412   AST 17 08/17/2023 0412   ALT 21 08/17/2023 0412   ALKPHOS 42 08/17/2023 0412   BILITOT 1.2 08/17/2023 0412   GFRNONAA >60 08/17/2023  0412   Lipase     Component Value Date/Time   LIPASE 13 08/14/2023 0724       Studies/Results: No results found.  Anti-infectives: Anti-infectives (From admission, onward)    None        Assessment/Plan History of SBO requiring surgical intervention SBO -CT abd/pel w/ contrast from 08/14/2023 showed recurrent diffuse small bowel dilation, suspicious for partial distal small bowel obstruction. No focal transition point or etiology identified. Fluid-filled structure with internal air in the central false pelvis appears contiguous with small bowel on the coronal images, and likely a postsurgical finding. Cholelithiasis without evidence of cholecystitis or biliary dilatation. 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 -Lipase 13 on 08/14/2023 -WBC 2.7 -Afebrile.  -SBO protocol on 08/15/2023 with NPO/NGT. DG Abd port 1v-SBO protocol showed PO contrast reaching the rectum. Normal bowel gas pattern. NGT tube removed 08/15/2023 -No emesis or nausea since removal of NGT tube. Cramping with FLD. Bowel function and flatulence present. Advance to soft diet.   FEN: Soft VTE: Enoxaparin  ID: None     LOS: 3 days   I reviewed hospitalist notes, last 24 h vitals and pain scores, last 48 h intake and output, last 24 h labs and trends, and last 24 h imaging results.  This care required moderate level of medical decision making.    Phu Record Paige  [facility] Surgery 08/17/2023, 8:24 AM Please see Amion for pager number during day hours 7:00am-4:30pm

## 2023-08-17 NOTE — Plan of Care (Signed)

## 2023-08-18 MED ORDER — ORAL CARE MOUTH RINSE: 15.0000 mL | OROMUCOSAL | Status: DC | PRN

## 2023-08-18 NOTE — Discharge Summary (Signed)
 Physician Discharge Summary   Patient: Steven Owens MRN: [id number] DOB: Apr 23, 1990  Admit date:     08/14/2023  Discharge date: 08/18/23  Discharge Physician: Garnette Pelt   PCP: System, Provider Not In   Recommendations at discharge:    Follow up with PCP in 1-2 weeks Follow up with General Surgery as needed  Discharge Diagnoses: Principal Problem:   SBO (small bowel obstruction) (HCC)  Resolved Problems:   * No resolved hospital problems. *  Hospital Course: 33 y.o. male with medical history significant for abdominal surgery as an infant resulting in small bowel obstruction and surgical repair approximately 1 decade ago now being admitted to the hospital for 1 day of lower abdominal pain found to have partial small bowel obstruction.  Patient was admitted to the hospital in [facility] about a decade ago, from his description sounds like perhaps he had lysis of adhesions, he does not think any bowel was removed.  He is pretty healthy, does not take any regular medications starting yesterday morning he was having lower abdominal pain without radiation or associated fevers, nausea, or diarrhea.  He had a small bowel movement this morning.  He does feel bloated.  Although yesterday he did not have much of an appetite due to the abdominal pain.  He does not think he has been passing gas this morning.  He presented to the ER for evaluation and workup as detailed below shows evidence of partial small bowel obstruction.   Assessment and Plan: Partial small bowel obstruction-with abdominal pain for the last 24 hours, without obvious transition point or etiology seen on CT scan.   -Pt was continued on pain and nausea medication as needed -General Surgery following. Slowly improving. Now successfully advanced to soft diet -continue ambulation as tolerated. -f/u with General Surgery as needed   Hypertension -likely secondary to feeling uncomfortable -BP improved with symptom managment           Consultants: General Surgery Procedures performed:   Disposition: Home Diet recommendation:  Regular diet DISCHARGE MEDICATION: Allergies as of 08/18/2023   No Known Allergies      Medication List    You have not been prescribed any medications.     Follow-up Information     Follow up with your PCP in 2-3 weeks Follow up.   Why: Hospital follow up        Eletha Boas, MD Follow up.   Specialty: General Surgery Why: As needed Contact information: [address] [place] [address] [phone]                Discharge Exam: Fredricka Weights   08/14/23 0721  Weight: 99.8 kg   General exam: Awake, laying in bed, in nad Respiratory system: Normal respiratory effort, no wheezing Cardiovascular system: regular rate, s1, s2 Gastrointestinal system: Soft, nondistended, positive BS Central nervous system: CN2-12 grossly intact, strength intact Extremities: Perfused, no clubbing Skin: Normal skin turgor, no notable skin lesions seen Psychiatry: Mood normal // no visual hallucinations   Condition at discharge: fair  The results of significant diagnostics from this hospitalization (including imaging, microbiology, ancillary and laboratory) are listed below for reference.   Imaging Studies: DG Abd Portable 1V-Small Bowel Obstruction Protocol-initial, 8 hr delay Result Date: 08/15/2023 CLINICAL DATA:  Small bowel protocol, 8 hours post contrast EXAM: PORTABLE ABDOMEN - 1 VIEW COMPARISON:  CT abdomen pelvis 08/14/2023, x-ray abdomen 08/14/2023 2:49 p.m. FINDINGS: Enteric tube with tip overlying the expected region of the gastric  lumen. PO contrast reaches the rectum. The bowel gas pattern is normal. No radio-opaque calculi or other significant radiographic abnormality are seen. IMPRESSION: 1. PO contrast reaches the rectum. 2. Nonobstructive bowel gas pattern. Electronically Signed   By: Morgane  Naveau M.D.   On: 08/15/2023 00:11   DG Abd  Portable 1V-Small Bowel Protocol-Position Verification Result Date: 08/14/2023 CLINICAL DATA:  NG tube placement EXAM: PORTABLE ABDOMEN - 1 VIEW COMPARISON:  CT today FINDINGS: NG tube tip is in the mid stomach.  Visualized lungs clear. IMPRESSION: NG tube in the mid stomach. Electronically Signed   By: Franky Crease M.D.   On: 08/14/2023 15:19   CT ABDOMEN PELVIS W CONTRAST Result Date: 08/14/2023 CLINICAL DATA:  Abdominal pain, acute, nonlocalized Mid abdominal pain since yesterday. EXAM: CT ABDOMEN AND PELVIS WITH CONTRAST TECHNIQUE: Multidetector CT imaging of the abdomen and pelvis was performed using the standard protocol following bolus administration of intravenous contrast. RADIATION DOSE REDUCTION: This exam was performed according to the departmental dose-optimization program which includes automated exposure control, adjustment of the mA and/or kV according to patient size and/or use of iterative reconstruction technique. CONTRAST:  OMNIPAQUE  IOHEXOL  300 MG/ML  SOLN COMPARISON:  Abdominopelvic CT 04/01/2016. FINDINGS: Lower chest: Clear lung bases. No significant pleural or pericardial effusion. Hepatobiliary: The liver is normal in density without suspicious focal abnormality. Multiple calcified gallstones are noted. No evidence of gallbladder wall thickening or biliary dilatation. Pancreas: Unremarkable. No pancreatic ductal dilatation or surrounding inflammatory changes. 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 There are new thin septal calcifications within this lesion. No other focal splenic abnormality. Adrenals/Urinary Tract: Both adrenal glands appear normal. No evidence of urinary tract calculus, suspicious renal lesion or hydronephrosis. The bladder appears unremarkable for its degree of distention. Stomach/Bowel: No enteric contrast administered. The stomach appears unremarkable for its degree of distention. Small  bowel anastomosis clips are again noted within the left mid abdomen, and there are multiple dilated loops of fluid-filled small bowel, slightly increased from the previous study. The terminal ileum appears decompressed without focal transition point. A fluid-filled structure with internal air in the central false pelvis measures 7.3 x 5.2 cm, appearing contiguous with small bowel on the coronal images, and likely a postsurgical finding. The colon is decompressed. Vascular/Lymphatic: There are no enlarged abdominal or pelvic lymph nodes. No significant vascular findings. Reproductive: The prostate gland and seminal vesicles appear unremarkable. Other: Postsurgical changes in the anterior abdominal wall. No evidence of hernia, ascites or pneumoperitoneum. Musculoskeletal: No acute or significant osseous findings. IMPRESSION: 1. Recurrent diffuse small bowel dilatation, suspicious for partial distal small bowel obstruction. No focal transition point or etiology identified. 2. Fluid-filled structure with internal air in the central false pelvis appears contiguous with small bowel on the coronal images, and likely a postsurgical finding. 3. Cholelithiasis without evidence of cholecystitis or biliary dilatation. 4. 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 Electronically Signed   By: Elsie Perone M.D.   On: 08/14/2023 10:35    Microbiology: Results for orders placed or performed during the hospital encounter of 01/05/22  Resp panel by RT-PCR (RSV, Flu A&B, Covid) Anterior Nasal Swab     Status: Abnormal   Collection Time: 01/05/22  5:50 AM   Specimen: Anterior Nasal Swab  Result Value Ref Range Status   SARS Coronavirus 2 by RT PCR NEGATIVE NEGATIVE Final    Comment: (NOTE) SARS-CoV-2 target  nucleic acids are NOT DETECTED.  The SARS-CoV-2 RNA is generally detectable in upper respiratory specimens during the acute phase of infection. The lowest concentration of SARS-CoV-2  viral copies this assay can detect is 138 copies/mL. A negative result does not preclude SARS-Cov-2 infection and should not be used as the sole basis for treatment or other patient management decisions. A negative result may occur with  improper specimen collection/handling, submission of specimen other than nasopharyngeal swab, presence of viral mutation(s) within the areas targeted by this assay, and inadequate number of viral copies(<138 copies/mL). A negative result must be combined with clinical observations, patient history, and epidemiological information. The expected result is Negative.  Fact Sheet for Patients:  BloggerCourse.com  Fact Sheet for Healthcare Providers:  SeriousBroker.it  This test is no t yet approved or cleared by the [place]  FDA and  has been authorized for detection and/or diagnosis of SARS-CoV-2 by FDA under an Emergency Use Authorization (EUA). This EUA will remain  in effect (meaning this test can be used) for the duration of the COVID-19 declaration under Section 564(b)(1) of the Act, 21 U.S.C.section 360bbb-3(b)(1), unless the authorization is terminated  or revoked sooner.       Influenza A by PCR POSITIVE (A) NEGATIVE Final   Influenza B by PCR NEGATIVE NEGATIVE Final    Comment: (NOTE) The Xpert Xpress SARS-CoV-2/FLU/RSV plus assay is intended as an aid in the diagnosis of influenza from Nasopharyngeal swab specimens and should not be used as a sole basis for treatment. Nasal washings and aspirates are unacceptable for Xpert Xpress SARS-CoV-2/FLU/RSV testing.  Fact Sheet for Patients: BloggerCourse.com  Fact Sheet for Healthcare Providers: SeriousBroker.it  This test is not yet approved or cleared by the [place]  FDA and has been authorized for detection and/or diagnosis of SARS-CoV-2 by FDA under an Emergency Use Authorization  (EUA). This EUA will remain in effect (meaning this test can be used) for the duration of the COVID-19 declaration under Section 564(b)(1) of the Act, 21 U.S.C. section 360bbb-3(b)(1), unless the authorization is terminated or revoked.     Resp Syncytial Virus by PCR NEGATIVE NEGATIVE Final    Comment: (NOTE) Fact Sheet for Patients: BloggerCourse.com  Fact Sheet for Healthcare Providers: SeriousBroker.it  This test is not yet approved or cleared by the [place]  FDA and has been authorized for detection and/or diagnosis of SARS-CoV-2 by FDA under an Emergency Use Authorization (EUA). This EUA will remain in effect (meaning this test can be used) for the duration of the COVID-19 declaration under Section 564(b)(1) of the Act, 21 U.S.C. section 360bbb-3(b)(1), unless the authorization is terminated or revoked.  Performed at [facility], [address] [place], [place] [address]     Labs: CBC: Recent Labs  Lab 08/14/23 [phone] 08/15/23 0456 08/16/23 0927 08/17/23 0412  WBC 3.6* 3.3* 2.8* 2.7*  HGB 15.1 14.6 15.1 14.5  HCT 45.6 46.4 47.6 46.3  MCV 88.7 92.8 93.3 91.7  PLT 208 196 219 202   Basic Metabolic Panel: Recent Labs  Lab 08/14/23 0724 08/15/23 0456 08/16/23 0927 08/17/23 0412  NA 139 134* 139 136  K 4.2 4.7 4.2 3.9  CL 102 101 104 100  CO2 25 18* 21* 23  GLUCOSE 104* 64* 73 73  BUN 7 10 12 13   CREATININE 0.93 0.89 0.94 0.99  CALCIUM 9.2 8.2* 9.1 8.8*   Liver Function Tests: Recent Labs  Lab 08/14/23 0724 08/16/23 0927 08/17/23 0412  AST 22 20 17  ALT 27 24 21   ALKPHOS 47 46 42  BILITOT 1.6* 1.6* 1.2  PROT 6.9 7.2 6.7  ALBUMIN 4.5 4.0 3.7   CBG: No results for input(s): GLUCAP in the last 168 hours.  Discharge time spent: less than 30 minutes.  Signed: Garnette Pelt, MD [facility] 08/18/2023

## 2023-08-18 NOTE — Progress Notes (Signed)
   Subjective/Chief Complaint: Patient denies abdominal pain or nausea BM reported in the chart but pt denies. Currently on a soft diet   Objective: Vital signs in last 24 hours: Temp:  [97.9 F (36.6 C)-98 F (36.7 C)] 97.9 F (36.6 C) (08/09 9386) Pulse Rate:  [51-57] 51 (08/09 0613) Resp:  [15-16] 15 (08/09 0613) BP: (119-140)/(71-84) 119/71 (08/09 0613) SpO2:  [99 %-100 %] 99 % (08/09 0613) Last BM Date : 08/17/23  Intake/Output from previous day: 08/08 0701 - 08/09 0700 In: 900 [P.O.:900] Out: 0  Intake/Output this shift: No intake/output data recorded.  Exam: Awake and alert Looks comfortable Abdomen soft, non-distended, no guarding  Lab Results:  Recent Labs    08/16/23 0927 08/17/23 0412  WBC 2.8* 2.7*  HGB 15.1 14.5  HCT 47.6 46.3  PLT 219 202   BMET Recent Labs    08/16/23 0927 08/17/23 0412  NA 139 136  K 4.2 3.9  CL 104 100  CO2 21* 23  GLUCOSE 73 73  BUN 12 13  CREATININE 0.94 0.99  CALCIUM 9.1 8.8*   PT/INR No results for input(s): LABPROT, INR in the last 72 hours. ABG No results for input(s): PHART, HCO3 in the last 72 hours.  Invalid input(s): PCO2, PO2  Studies/Results: No results found.  Anti-infectives: Anti-infectives (From admission, onward)    None       Assessment/Plan: History of SBO requiring surgical intervention SBO -CT abd/pel w/ contrast from 08/14/2023 showed recurrent diffuse small bowel dilation, suspicious for partial distal small bowel obstruction. No focal transition point or etiology identified. Fluid-filled structure with internal air in the central false pelvis appears contiguous with small bowel on the coronal images, and likely a postsurgical finding. Cholelithiasis without evidence of cholecystitis or biliary dilatation. 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  -SBO protocol on 08/15/2023 with NPO/NGT. DG Abd port 1v-SBO protocol showed PO contrast  reaching the rectum. Normal bowel gas pattern. NGT tube removed 08/15/2023 -No emesis or nausea on soft diet -ok to discharge home today vs tomorrow from a surgical standpoint if he continues to tolerate po  Vicenta Poli MD 08/18/2023
